# Patient Record
Sex: Female | Born: 1965 | Race: White | Marital: Married | Smoking: Current every day smoker
Health system: Southern US, Community
[De-identification: ages and names within clinical notes are randomized; demographics above are authoritative.]

## PROBLEM LIST (undated history)

## (undated) DIAGNOSIS — M5442 Lumbago with sciatica, left side: Secondary | ICD-10-CM

## (undated) DIAGNOSIS — J45909 Unspecified asthma, uncomplicated: Secondary | ICD-10-CM

## (undated) DIAGNOSIS — I10 Essential (primary) hypertension: Secondary | ICD-10-CM

## (undated) DIAGNOSIS — K219 Gastro-esophageal reflux disease without esophagitis: Secondary | ICD-10-CM

## (undated) DIAGNOSIS — M797 Fibromyalgia: Secondary | ICD-10-CM

## (undated) DIAGNOSIS — M810 Age-related osteoporosis without current pathological fracture: Secondary | ICD-10-CM

## (undated) DIAGNOSIS — J449 Chronic obstructive pulmonary disease, unspecified: Secondary | ICD-10-CM

## (undated) DIAGNOSIS — K2950 Unspecified chronic gastritis without bleeding: Secondary | ICD-10-CM

## (undated) DIAGNOSIS — M4850XA Collapsed vertebra, not elsewhere classified, site unspecified, initial encounter for fracture: Secondary | ICD-10-CM

## (undated) HISTORY — PX: APPENDECTOMY: SHX54

## (undated) HISTORY — PX: CHOLECYSTECTOMY: SHX55

---
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
## 2018-04-21 ENCOUNTER — Other Ambulatory Visit: Payer: Self-pay

## 2018-04-21 ENCOUNTER — Emergency Department
Admission: EM | Admit: 2018-04-21 | Discharge: 2018-04-21 | Disposition: A | Discharge status: Home / Self Care | Payer: PRIVATE HEALTH INSURANCE | Attending: Emergency Medicine | Admitting: Emergency Medicine

## 2018-04-21 ENCOUNTER — Emergency Department: Payer: PRIVATE HEALTH INSURANCE

## 2018-04-21 DIAGNOSIS — W010XXA Fall on same level from slipping, tripping and stumbling without subsequent striking against object, initial encounter: Secondary | ICD-10-CM | POA: Diagnosis not present

## 2018-04-21 DIAGNOSIS — J45909 Unspecified asthma, uncomplicated: Secondary | ICD-10-CM | POA: Insufficient documentation

## 2018-04-21 DIAGNOSIS — J449 Chronic obstructive pulmonary disease, unspecified: Secondary | ICD-10-CM | POA: Diagnosis not present

## 2018-04-21 DIAGNOSIS — M25562 Pain in left knee: Secondary | ICD-10-CM | POA: Insufficient documentation

## 2018-04-21 DIAGNOSIS — M25561 Pain in right knee: Secondary | ICD-10-CM | POA: Insufficient documentation

## 2018-04-21 DIAGNOSIS — Y9389 Activity, other specified: Secondary | ICD-10-CM | POA: Insufficient documentation

## 2018-04-21 DIAGNOSIS — Y999 Unspecified external cause status: Secondary | ICD-10-CM | POA: Diagnosis not present

## 2018-04-21 DIAGNOSIS — Y92009 Unspecified place in unspecified non-institutional (private) residence as the place of occurrence of the external cause: Secondary | ICD-10-CM | POA: Insufficient documentation

## 2018-04-21 HISTORY — DX: Fibromyalgia: M79.7

## 2018-04-21 HISTORY — DX: Chronic obstructive pulmonary disease, unspecified: J44.9

## 2018-04-21 HISTORY — DX: Gastro-esophageal reflux disease without esophagitis: K21.9

## 2018-04-21 HISTORY — DX: Unspecified asthma, uncomplicated: J45.909

## 2018-04-21 MED ORDER — TRAMADOL HCL 50 MG PO TABS: 50.0000 mg | ORAL_TABLET | Freq: Four times a day (QID) | ORAL | 0 refills | Status: DC | PRN

## 2018-04-21 MED ORDER — TRAMADOL HCL 50 MG PO TABS
50.0000 mg | ORAL_TABLET | Freq: Once | ORAL | Status: AC
  Administered 2018-04-21: 50 mg via ORAL
  Filled 2018-04-21: qty 1

## 2018-04-21 NOTE — ED Notes (Addendum)
Pt states Wednesday was playing with dog and fell on bilat knees inside house. States pain began Thursday AM. States R worse than L. No bruising noted. Movement makes pain worse per pt.   Alert, oriented, no distress noted. Family with pt.

## 2018-04-21 NOTE — ED Notes (Signed)
Pt returned from xray via stretcher.

## 2018-04-21 NOTE — ED Triage Notes (Signed)
Bilateral knee pain since came down on knees 3 days ago.

## 2018-04-21 NOTE — Discharge Instructions (Signed)
Follow up with either orthopedics or primary care if not improving over the week. Return to the ER for symptoms that change or worsen if unable to schedule an appointment.

## 2018-04-21 NOTE — ED Provider Notes (Signed)
[facility] Provider Note ____________________________________________  Time seen: Approximately 9:25 AM  I have reviewed the triage vital signs and the nursing notes.   HISTORY  Chief Complaint Knee Pain    HPI Haley Hebert is a 52 y.o. female who presents to the emergency department for evaluation and treatment of bilateral knee pain after mechanical, non-syncopal fall 3 days ago.  She states she came in side after playing with the dogs outside and slipped on a rug.  She states that she landed directly on her knees.  She did not experience pain immediately afterward, but the next day she awakened with severe pain that has not been relieved with ibuprofen.  She has been unable to seek medical attention for this complaint because she had to transport her a child from [place] back to this area.  Past Medical History:  Diagnosis Date  . Acid reflux   . Asthma   . COPD (chronic obstructive pulmonary disease) ([place])   . Fibromyalgia     There are no active problems to display for this patient.   Prior to Admission medications   Medication Sig Start Date End Date Taking? Authorizing Provider  traMADol (ULTRAM) 50 MG tablet Take 1 tablet (50 mg total) by mouth every 6 (six) hours as needed. 04/21/18   Victorino Dike, FNP    Allergies Patient has no known allergies.  No family history on file.  Social History Social History   Tobacco Use  . Smoking status: Not on file  Substance Use Topics  . Alcohol use: Not on file  . Drug use: Not on file    Review of Systems Constitutional: Negative for fever. Cardiovascular: Negative for chest pain. Respiratory: Negative for shortness of breath. Musculoskeletal: Positive for bilateral knee pain with radiation into the lower extremities Skin: Negative for open wound or lesion. Neurological: Negative for decrease in  sensation  ____________________________________________   PHYSICAL EXAM:  VITAL SIGNS: ED Triage Vitals  Enc Vitals Group     BP 04/21/18 0859 113/76     Pulse Rate 04/21/18 0859 66     Resp 04/21/18 0859 18     Temp 04/21/18 0859 97.7 F (36.5 C)     Temp Source 04/21/18 0859 Oral     SpO2 04/21/18 0859 94 %     Weight 04/21/18 0901 202 lb (91.6 kg)     Height 04/21/18 0901 5\' 2"  (1.575 m)     Head Circumference --      Peak Flow --      Pain Score 04/21/18 0901 9     Pain Loc --      Pain Edu? --      Excl. in [place]? --     Constitutional: Alert and oriented. Well appearing and in no acute distress. Eyes: Conjunctivae are clear without discharge or drainage Head: Atraumatic Neck: Supple.  No focal midline tenderness Respiratory: No cough. Respirations are even and unlabored. Musculoskeletal: Diffuse tenderness over the anterior knees bilaterally.  No focal tenderness.  No edema or obvious joint effusion.  No deformity of the lower extremities on exam.  No edema in the ankles.  Full, active range of motion observed while distracted. Neurologic: Motor and sensory function is intact. Skin: Intact.  No lesions, abrasions, or contusions noted. Psychiatric: Affect and behavior are appropriate.  ____________________________________________   LABS (all labs ordered are listed, but only abnormal results are displayed)  Labs Reviewed - No data to display ____________________________________________  RADIOLOGY  Right and left knee images are negative for bony abnormality per radiology. ____________________________________________   PROCEDURES  Procedures  ____________________________________________   INITIAL IMPRESSION / ASSESSMENT AND PLAN / ED COURSE  Ben Sanz is a 52 y.o. who presents to the emergency department for treatment and evaluation of bilateral knee pain.  She states that the right knee is more painful than the left.  Distracted, the patient flexes  and extends the knees but asked to do so on exam, she guards and is resistant to rotation.  Patient instructed to follow-up with orthopedics or primary care if not improving over the week.  She was also instructed to return to the emergency department for symptoms that change or worsen if unable schedule an appointment with orthopedics or primary care.  Medications  traMADol (ULTRAM) tablet 50 mg (has no administration in time range)    Pertinent labs & imaging results that were available during my care of the patient were reviewed by me and considered in my medical decision making (see chart for details).  _________________________________________   FINAL CLINICAL IMPRESSION(S) / ED DIAGNOSES  Final diagnoses:  Acute pain of both knees    ED Discharge Orders         Ordered    traMADol (ULTRAM) 50 MG tablet  Every 6 hours PRN     04/21/18 1028           If controlled substance prescribed during this visit, 12 month history viewed on the [place] prior to issuing an initial prescription for Schedule II or III opiod.    Victorino Dike, FNP 04/21/18 1034    Earleen Newport, MD 04/21/18 [phone]

## 2018-08-06 DIAGNOSIS — K2100 Gastro-esophageal reflux disease with esophagitis, without bleeding: Secondary | ICD-10-CM

## 2018-08-06 DIAGNOSIS — K2950 Unspecified chronic gastritis without bleeding: Secondary | ICD-10-CM

## 2018-08-06 HISTORY — DX: Unspecified chronic gastritis without bleeding: K29.50

## 2018-08-06 HISTORY — DX: Gastro-esophageal reflux disease with esophagitis, without bleeding: K21.00

## 2018-08-27 ENCOUNTER — Ambulatory Visit: Payer: PRIVATE HEALTH INSURANCE | Admitting: Nurse Practitioner

## 2018-09-25 ENCOUNTER — Ambulatory Visit: Payer: PRIVATE HEALTH INSURANCE | Admitting: Nurse Practitioner

## 2018-10-11 DIAGNOSIS — K219 Gastro-esophageal reflux disease without esophagitis: Secondary | ICD-10-CM

## 2018-10-11 DIAGNOSIS — J45909 Unspecified asthma, uncomplicated: Secondary | ICD-10-CM | POA: Insufficient documentation

## 2018-10-11 DIAGNOSIS — M810 Age-related osteoporosis without current pathological fracture: Secondary | ICD-10-CM

## 2018-10-11 DIAGNOSIS — I10 Essential (primary) hypertension: Secondary | ICD-10-CM

## 2018-10-11 DIAGNOSIS — M4850XA Collapsed vertebra, not elsewhere classified, site unspecified, initial encounter for fracture: Secondary | ICD-10-CM

## 2018-10-11 DIAGNOSIS — J449 Chronic obstructive pulmonary disease, unspecified: Secondary | ICD-10-CM | POA: Insufficient documentation

## 2018-10-11 DIAGNOSIS — J441 Chronic obstructive pulmonary disease with (acute) exacerbation: Secondary | ICD-10-CM | POA: Insufficient documentation

## 2018-10-11 DIAGNOSIS — M5442 Lumbago with sciatica, left side: Secondary | ICD-10-CM

## 2018-10-11 HISTORY — DX: Gastro-esophageal reflux disease without esophagitis: K21.9

## 2018-10-11 HISTORY — DX: Age-related osteoporosis without current pathological fracture: M81.0

## 2018-10-11 HISTORY — DX: Lumbago with sciatica, left side: M54.42

## 2018-10-11 HISTORY — DX: Collapsed vertebra, not elsewhere classified, site unspecified, initial encounter for fracture: M48.50XA

## 2018-10-11 HISTORY — DX: Essential (primary) hypertension: I10

## 2018-10-17 ENCOUNTER — Ambulatory Visit: Payer: PRIVATE HEALTH INSURANCE | Admitting: Nurse Practitioner

## 2018-12-21 ENCOUNTER — Ambulatory Visit: Payer: Self-pay | Admitting: Urology

## 2019-01-22 ENCOUNTER — Other Ambulatory Visit: Payer: Self-pay

## 2019-01-22 DIAGNOSIS — N393 Stress incontinence (female) (male): Secondary | ICD-10-CM

## 2019-01-25 ENCOUNTER — Ambulatory Visit: Payer: PRIVATE HEALTH INSURANCE | Admitting: Urology

## 2019-03-01 ENCOUNTER — Ambulatory Visit: Payer: PRIVATE HEALTH INSURANCE | Admitting: Urology

## 2019-03-12 ENCOUNTER — Other Ambulatory Visit: Payer: Self-pay

## 2019-03-15 ENCOUNTER — Other Ambulatory Visit: Payer: Self-pay

## 2019-03-15 ENCOUNTER — Encounter: Payer: Self-pay | Admitting: Urology

## 2019-03-15 ENCOUNTER — Ambulatory Visit (INDEPENDENT_AMBULATORY_CARE_PROVIDER_SITE_OTHER): Payer: Medicaid Other | Admitting: Urology

## 2019-03-15 ENCOUNTER — Other Ambulatory Visit
Admission: RE | Admit: 2019-03-15 | Discharge: 2019-03-15 | Disposition: A | Discharge status: Home / Self Care | Payer: Medicaid Other | Attending: Urology | Admitting: Urology

## 2019-03-15 VITALS — BP 101/67 | HR 76 | Ht 62.0 in | Wt 207.0 lb

## 2019-03-15 DIAGNOSIS — R159 Full incontinence of feces: Secondary | ICD-10-CM | POA: Diagnosis not present

## 2019-03-15 DIAGNOSIS — N393 Stress incontinence (female) (male): Secondary | ICD-10-CM | POA: Diagnosis not present

## 2019-03-15 DIAGNOSIS — R350 Frequency of micturition: Secondary | ICD-10-CM | POA: Diagnosis not present

## 2019-03-15 LAB — URINALYSIS, COMPLETE (UACMP) WITH MICROSCOPIC
Glucose, UA: NEGATIVE mg/dL
Hgb urine dipstick: NEGATIVE
Leukocytes,Ua: NEGATIVE
Nitrite: NEGATIVE
Protein, ur: NEGATIVE mg/dL
Specific Gravity, Urine: 1.025 (ref 1.005–1.030)
WBC, UA: NONE SEEN WBC/hpf (ref 0–5)
pH: 5 (ref 5.0–8.0)

## 2019-03-15 LAB — BLADDER SCAN AMB NON-IMAGING

## 2019-03-15 MED ORDER — OXYBUTYNIN CHLORIDE ER 15 MG PO TB24: 15.0000 mg | ORAL_TABLET | Freq: Every day | ORAL | 3 refills | Status: DC

## 2019-03-15 NOTE — Progress Notes (Signed)
03/15/2019 3:18 PM   [facility] [birth year] [id number]  Referring provider: Ulyess Blossom, [place],  [place] [address]  Chief Complaint  Patient presents with  . Urinary Incontinence    HPI: 53 year old female who presents today for further evaluation of urinary incontinence.  She initially thought that she was seeing pain management today and did not realize that she had been referred to urology.  She does report that she has had both urinary and fecal incontinence for years now.  This is bothersome to her.  She reports that she has daytime frequency and urgency with least a weekly episode of urge incontinence.  She gets up 2 times at night to void.  She occasionally has enuresis as well.  She also leaks when she laughs, coughs, sneezes.  This is been going on for 10+ years as well.  She has COPD/asthma and thus a chronic cough with ongoing smoking which exacerbates her symptoms.  She has had 4 vaginal deliveries.  No history of hysterectomy.  No UTIs.  No dysuria or hematuria.  She drinks at least 2 cups of coffee very day.  When her daughter was here, she reports that she drink "coffee all day every day"   she has since cut back.  She also drinks iced tea frequently.  In terms of fecal incontinence, she has loose stool often and occasionally cannot control defecation.  She is seeing gastroenterology in the near future.  She denies any symptoms of vaginal bulging or pain with intercourse.  She is never tried any medications for OAB.  No exacerbating or alleviating symptoms.  PMH: Past Medical History:  Diagnosis Date  . Acid reflux   . Asthma   . Chronic gastritis 08/06/2018  . Collapsed vertebra ([place]) 10/11/2018   lumbosacral  . COPD (chronic obstructive pulmonary disease) ([place])   . Esophagitis, reflux 08/06/2018  . Fibromyalgia   . GERD (gastroesophageal reflux disease) 10/11/2018  . Hypertension 10/11/2018  . Lumbago with sciatica, left side  10/11/2018  . Osteoporosis 10/11/2018    Surgical History: History reviewed. No pertinent surgical history.  Home Medications:  Allergies as of 03/15/2019   No Known Allergies     Medication List       Accurate as of March 15, 2019 11:59 PM. If you have any questions, ask your nurse or doctor.        STOP taking these medications   escitalopram 20 MG tablet Commonly known as: LEXAPRO Stopped by: Hollice Espy, MD     TAKE these medications   busPIRone 7.5 MG tablet Commonly known as: BUSPAR TAKE 1 TABLET (7.5 MG TOTAL) BY MOUTH 2 (TWO) TIMES DAILY FOR 90 DAYS   cetirizine 10 MG tablet Commonly known as: ZYRTEC Take 10 mg by mouth daily.   meloxicam 15 MG tablet Commonly known as: MOBIC TAKE 1 TABLET (15 MG TOTAL) BY MOUTH ONCE DAILY AS NEEDED FOR PAIN   montelukast 10 MG tablet Commonly known as: SINGULAIR TAKE 1 TABLET BY MOUTH EVERY DAY AT NIGHT   omeprazole 40 MG capsule Commonly known as: PRILOSEC Take 40 mg by mouth 2 (two) times daily.   oxybutynin 15 MG 24 hr tablet Commonly known as: DITROPAN XL Take 1 tablet (15 mg total) by mouth daily. Started by: Hollice Espy, MD   pregabalin 200 MG capsule Commonly known as: LYRICA Take 200 mg by mouth 2 (two) times daily.   tiZANidine 4 MG tablet Commonly known as: ZANAFLEX TAKE 1  TABLET (4 MG TOTAL) BY MOUTH 3 (THREE) TIMES DAILY AS NEEDED FOR MUSCLE SPASMS   traMADol 50 MG tablet Commonly known as: Ultram Take 1 tablet (50 mg total) by mouth every 6 (six) hours as needed.   traZODone 150 MG tablet Commonly known as: DESYREL TAKE 2 TABLETS (300 MG TOTAL) BY MOUTH NIGHTLY   Trelegy Ellipta 100-62.5-25 MCG/INH Aepb Generic drug: Fluticasone-Umeclidin-Vilant INHALE 1 INHALATION INTO THE LUNGS EVERY MORNING       Allergies: No Known Allergies  Family History: History reviewed. No pertinent family history.  Social History:  has no history on file for tobacco, alcohol, and drug.  ROS:  UROLOGY Frequent Urination?: Yes Hard to postpone urination?: Yes Burning/pain with urination?: No Get up at night to urinate?: Yes Leakage of urine?: Yes Urine stream starts and stops?: No Trouble starting stream?: No Do you have to strain to urinate?: No Blood in urine?: No Urinary tract infection?: No Sexually transmitted disease?: No Injury to kidneys or bladder?: No Painful intercourse?: No Weak stream?: No Currently pregnant?: No Vaginal bleeding?: No Last menstrual period?: n  Gastrointestinal Nausea?: No Vomiting?: No Indigestion/heartburn?: No Diarrhea?: No Constipation?: No  Constitutional Fever: No Night sweats?: Yes Weight loss?: No Fatigue?: No  Skin Skin rash/lesions?: No Itching?: No  Eyes Blurred vision?: Yes Double vision?: No  Ears/Nose/Throat Sore throat?: No Sinus problems?: Yes  Hematologic/Lymphatic Swollen glands?: No Easy bruising?: Yes  Cardiovascular Leg swelling?: Yes Chest pain?: No  Respiratory Cough?: Yes Shortness of breath?: Yes  Endocrine Excessive thirst?: Yes  Musculoskeletal Back pain?: Yes Joint pain?: Yes  Neurological Headaches?: No Dizziness?: No  Psychologic Depression?: Yes Anxiety?: Yes  Physical Exam: BP 101/67   Pulse 76   Ht 5\' 2"  (1.575 m)   Wt 207 lb (93.9 kg)   BMI 37.86 kg/m   Constitutional:  Alert and oriented, No acute distress. HEENT: [place] AT, moist mucus membranes.  Trachea midline, no masses. Cardiovascular: No clubbing, cyanosis, or edema. Respiratory: Normal respiratory effort, no increased work of breathing. GI: Abdomen is soft, nontender, nondistended, no abdominal masses, obese Skin: No rashes, bruises or suspicious lesions. Neurologic: Grossly intact, no focal deficits, moving all 4 extremities. Psychiatric: Normal mood and affect.  Laboratory Data: Cr 1.0  1/20  Urinalysis Results for orders placed or performed during the hospital encounter of 03/15/19  Urinalysis,  Complete w Microscopic  Result Value Ref Range   Color, Urine YELLOW YELLOW   APPearance HAZY (A) CLEAR   Specific Gravity, Urine 1.025 1.005 - 1.030   pH 5.0 5.0 - 8.0   Glucose, UA NEGATIVE NEGATIVE mg/dL   Hgb urine dipstick NEGATIVE NEGATIVE   Bilirubin Urine SMALL (A) NEGATIVE   Ketones, ur TRACE (A) NEGATIVE mg/dL   Protein, ur NEGATIVE NEGATIVE mg/dL   Nitrite NEGATIVE NEGATIVE   Leukocytes,Ua NEGATIVE NEGATIVE   Squamous Epithelial / LPF 11-20 0 - 5   WBC, UA NONE SEEN 0 - 5 WBC/hpf   RBC / HPF 0-5 0 - 5 RBC/hpf   Bacteria, UA MANY (A) NONE SEEN   Ca Oxalate Crys, UA PRESENT      Pertinent Imaging: PVR minimal (35 cc)  Assessment & Plan:    1. Stress incontinence of urine Moderate stress incontinence, symptomatic  We discussed the pathophysiology of stress urinary incontinence.  Ideally, recommend control of respiratory disease which exacerbates her symptoms.  In addition to this, she would benefit from weight loss and pelvic floor exercises.  She was provided handout with how  to perform these and is interested in pursuing this.  Offered but declined physical therapy referral.  Relatively poor surgical candidate but may consider this down the road if her symptoms fail to improve with the above interventions. - BLADDER SCAN AMB NON-IMAGING  2. Urinary frequency Likely multifactorial  Lengthy discussion today about behavioral modification which would be the primary intervention.  She is interested in pharmacotherapy.  We will try oxybutynin 15 mg daily, adjust as needed.  Aware of possible side effects including dry eyes dry mouth and constipation.  3. Incontinence of feces, unspecified fecal incontinence type Fecal incontinence, etiology unclear  She is scheduled follow-up with GI.   Return in about 3 months (around 06/14/2019) for shannon.  Hollice Espy, MD  [facility] [address] [place], [place] [address] [phone]

## 2019-03-15 NOTE — Patient Instructions (Signed)

## 2019-03-18 ENCOUNTER — Encounter: Payer: Self-pay | Admitting: Urology

## 2019-06-17 ENCOUNTER — Ambulatory Visit: Payer: Medicaid Other | Admitting: Urology

## 2019-06-17 ENCOUNTER — Telehealth: Payer: Self-pay | Admitting: Urology

## 2019-06-17 NOTE — Telephone Encounter (Signed)
We can discuss any medication changes at her rescheduled appointment.

## 2019-06-17 NOTE — Telephone Encounter (Signed)
[facility] informing patient that medication changes can be discussed at next appointment.

## 2019-06-17 NOTE — Telephone Encounter (Signed)
Pt called to reschedule appt and asked to have her medication changed as well. The medication she's taking now isn't working. Please advise pt at [phone]

## 2019-07-07 NOTE — Progress Notes (Deleted)
07/08/2019 8:32 PM   [facility] 1966/06/20 [id number]  Referring provider: Ulyess Blossom, [address] [place],  [place] [address]  No chief complaint on file.   HPI: [facility]aley [facility] is a 54 year old female with urinary and fecal incontinence who presents today after a trial of oxybutynin.  She was seen by Dr. Erlene Quan on 03/15/2019 and was advised to decrease her weight, work with her PCP to getting better control of her coughing and to decrease her coffee intake.  She was started on oxybutynin.  The patient is  experiencing urgency x *** (***), frequency x *** (***), not/is restricting fluids to avoid visits to the restroom ***, not/is engaging in toilet mapping, incontinence x *** (***) and nocturia x *** (***).   Her BP is ***.   Her PVR is ***.    PMH: Past Medical History:  Diagnosis Date  . Acid reflux   . Asthma   . Chronic gastritis 08/06/2018  . Collapsed vertebra ([place]) 10/11/2018   lumbosacral  . COPD (chronic obstructive pulmonary disease) ([place])   . Esophagitis, reflux 08/06/2018  . Fibromyalgia   . GERD (gastroesophageal reflux disease) 10/11/2018  . Hypertension 10/11/2018  . Lumbago with sciatica, left side 10/11/2018  . Osteoporosis 10/11/2018    Surgical History: No past surgical history on file.  Home Medications:  Allergies as of 07/08/2019   No Known Allergies     Medication List       Accurate as of July 07, 2019  8:32 PM. If you have any questions, ask your nurse or doctor.        busPIRone 7.5 MG tablet Commonly known as: BUSPAR TAKE 1 TABLET (7.5 MG TOTAL) BY MOUTH 2 (TWO) TIMES DAILY FOR 90 DAYS   cetirizine 10 MG tablet Commonly known as: ZYRTEC Take 10 mg by mouth daily.   meloxicam 15 MG tablet Commonly known as: MOBIC TAKE 1 TABLET (15 MG TOTAL) BY MOUTH ONCE DAILY AS NEEDED FOR PAIN   montelukast 10 MG tablet Commonly known as: SINGULAIR TAKE 1 TABLET BY MOUTH EVERY DAY AT NIGHT   omeprazole 40 MG capsule Commonly  known as: PRILOSEC Take 40 mg by mouth 2 (two) times daily.   oxybutynin 15 MG 24 hr tablet Commonly known as: DITROPAN XL Take 1 tablet (15 mg total) by mouth daily.   pregabalin 200 MG capsule Commonly known as: LYRICA Take 200 mg by mouth 2 (two) times daily.   tiZANidine 4 MG tablet Commonly known as: ZANAFLEX TAKE 1 TABLET (4 MG TOTAL) BY MOUTH 3 (THREE) TIMES DAILY AS NEEDED FOR MUSCLE SPASMS   traMADol 50 MG tablet Commonly known as: Ultram Take 1 tablet (50 mg total) by mouth every 6 (six) hours as needed.   traZODone 150 MG tablet Commonly known as: DESYREL TAKE 2 TABLETS (300 MG TOTAL) BY MOUTH NIGHTLY   Trelegy Ellipta 100-62.5-25 MCG/INH Aepb Generic drug: Fluticasone-Umeclidin-Vilant INHALE 1 INHALATION INTO THE LUNGS EVERY MORNING       Allergies: No Known Allergies  Family History: No family history on file.  Social History:  has no history on file for tobacco, alcohol, and drug.  ROS:                                        Physical Exam: There were no vitals taken for this visit.  Constitutional:  Well nourished.  Alert and oriented, No acute distress. HEENT: [place] AT, moist mucus membranes.  Trachea midline, no masses. Cardiovascular: No clubbing, cyanosis, or edema. Respiratory: Normal respiratory effort, no increased work of breathing. GI: Abdomen is soft, non tender, non distended, no abdominal masses. Liver and spleen not palpable.  No hernias appreciated.  Stool sample for occult testing is not indicated.   GU: No CVA tenderness.  No bladder fullness or masses.  *** external genitalia, *** pubic hair distribution, no lesions.  Normal urethral meatus, no lesions, no prolapse, no discharge.   No urethral masses, tenderness and/or tenderness. No bladder fullness, tenderness or masses. *** vagina mucosa, *** estrogen effect, no discharge, no lesions, *** pelvic support, *** cystocele and *** rectocele noted.  No cervical motion  tenderness.  Uterus is freely mobile and non-fixed.  No adnexal/parametria masses or tenderness noted.  Anus and perineum are without rashes or lesions.   ***  Skin: No rashes, bruises or suspicious lesions. Lymph: No cervical or inguinal adenopathy. Neurologic: Grossly intact, no focal deficits, moving all 4 extremities. Psychiatric: Normal mood and affect.   Laboratory Data: No results found for: WBC, HGB, HCT, MCV, PLT  No results found for: CREATININE  No results found for: PSA  No results found for: TESTOSTERONE  No results found for: HGBA1C  No results found for: TSH  No results found for: CHOL, HDL, CHOLHDL, VLDL, LDLCALC  No results found for: AST No results found for: ALT No components found for: ALKALINEPHOPHATASE No components found for: BILIRUBINTOTAL  No results found for: ESTRADIOL  Urinalysis    Component Value Date/Time   COLORURINE YELLOW 03/15/2019 1321   APPEARANCEUR HAZY (A) 03/15/2019 1321   LABSPEC 1.025 03/15/2019 1321   PHURINE 5.0 03/15/2019 1321   GLUCOSEU NEGATIVE 03/15/2019 [place] 03/15/2019 1321   BILIRUBINUR SMALL (A) 03/15/2019 [place] (A) 03/15/2019 1321   PROTEINUR NEGATIVE 03/15/2019 1321   NITRITE NEGATIVE 03/15/2019 1321   [place] 03/15/2019 1321    I have reviewed the labs.   Pertinent Imaging: *** I have independently reviewed the films.    Assessment & Plan:  ***  1. Mixed Incontinence  - offered behavioral therapies  - bladder training  - bladder control strategies  - pelvic floor muscle training  - fluid management   - offered medical therapy with anticholinergic therapy or beta-3 adrenergic receptor agonist and the potential side effects of each therapy ***  - offered refer to gynecology for a pessary fitting ***  - offered an appointment with one of our surgeon for a possible pelvic sling procedure ***  - would like to try the beta-3 adrenergic receptor agonist  (Myrbetriq).  Given Myrbetriq 25 mg samples, #28.  I have reviewed with the patient of the side effects of Myrbetriq, such as: elevation in BP, urinary retention and/or HA.  She will return in one month for PVR and symptom recheck.  ***  - would like to try anticholinergic therapy.  Given Vesicare 5mg /10mg , Toviaz 4mg /8mg  samples, # 28.   Advised of the side effects, such as: Dry eyes, dry mouth, constipation, mental confusion and/or urinary retention. ***  - RTC in 3 weeks for PVR and symptom recheck ***   2. Fecal incontinence ***  No follow-ups on file.  These notes generated with voice recognition software. I apologize for typographical errors.  Zara Council, PA-C  [facility] [address]  [place], [place] [address] [phone]

## 2019-07-08 ENCOUNTER — Ambulatory Visit: Payer: Medicaid Other | Admitting: Urology

## 2019-07-16 ENCOUNTER — Ambulatory Visit
Admission: EM | Admit: 2019-07-16 | Discharge: 2019-07-16 | Disposition: A | Discharge status: Home / Self Care | Payer: Medicaid Other | Attending: Family Medicine | Admitting: Family Medicine

## 2019-07-16 ENCOUNTER — Other Ambulatory Visit: Payer: Self-pay

## 2019-07-16 ENCOUNTER — Encounter: Payer: Self-pay | Admitting: Emergency Medicine

## 2019-07-16 ENCOUNTER — Ambulatory Visit: Payer: Medicaid Other

## 2019-07-16 DIAGNOSIS — M5442 Lumbago with sciatica, left side: Secondary | ICD-10-CM | POA: Insufficient documentation

## 2019-07-16 DIAGNOSIS — M797 Fibromyalgia: Secondary | ICD-10-CM | POA: Diagnosis not present

## 2019-07-16 DIAGNOSIS — M533 Sacrococcygeal disorders, not elsewhere classified: Secondary | ICD-10-CM | POA: Diagnosis not present

## 2019-07-16 DIAGNOSIS — W11XXXA Fall on and from ladder, initial encounter: Secondary | ICD-10-CM

## 2019-07-16 DIAGNOSIS — G8929 Other chronic pain: Secondary | ICD-10-CM | POA: Insufficient documentation

## 2019-07-16 DIAGNOSIS — W19XXXA Unspecified fall, initial encounter: Secondary | ICD-10-CM | POA: Insufficient documentation

## 2019-07-16 DIAGNOSIS — M810 Age-related osteoporosis without current pathological fracture: Secondary | ICD-10-CM | POA: Diagnosis not present

## 2019-07-16 DIAGNOSIS — K219 Gastro-esophageal reflux disease without esophagitis: Secondary | ICD-10-CM | POA: Insufficient documentation

## 2019-07-16 DIAGNOSIS — Z79899 Other long term (current) drug therapy: Secondary | ICD-10-CM | POA: Insufficient documentation

## 2019-07-16 DIAGNOSIS — J449 Chronic obstructive pulmonary disease, unspecified: Secondary | ICD-10-CM | POA: Diagnosis not present

## 2019-07-16 DIAGNOSIS — I10 Essential (primary) hypertension: Secondary | ICD-10-CM | POA: Diagnosis not present

## 2019-07-16 DIAGNOSIS — F17210 Nicotine dependence, cigarettes, uncomplicated: Secondary | ICD-10-CM | POA: Insufficient documentation

## 2019-07-16 MED ORDER — PREDNISONE 10 MG PO TABS: ORAL_TABLET | ORAL | 0 refills | Status: DC

## 2019-07-16 NOTE — ED Provider Notes (Signed)
[facility] ____________________________________________  Time seen: Approximately 3:02 PM  I have reviewed the triage vital signs and the nursing notes.   HISTORY  Chief Complaint Tailbone Pain   HPI Haley Hebert is a 54 y.o. female presenting for evaluation of coccyx pain post fall.  Patient reports that 1 month ago she fell off of her ladder in her house.  States she fell approximately 4 feet to her pelvis.  States since then she has been having lower coccyx and sacral pain.  States it took a few days for the pain to really kick in and states the pain has been there since.  Does report she has chronic low back pain at baseline describing as her chronic sciatica, and reports no change in this.  States she does at baseline have occasional pain radiation down her leg but no changes.  Denies abdominal pain, chest pain, shortness of breath, paresthesias, unilateral weakness, urinary or bowel retention or incontinence or hip pain.  States that she may have hit her head when she fell and has had occasional headaches, but reports feeling better, no loss of consciousness, no confusion, no vision changes, no vomiting.  Reports otherwise doing well.  Has not had any images for this.  Ulyess Blossom, PA : PCP    Past Medical History:  Diagnosis Date  . Acid reflux   . Asthma   . Chronic gastritis 08/06/2018  . Collapsed vertebra ([place]) 10/11/2018   lumbosacral  . COPD (chronic obstructive pulmonary disease) ([place])   . Esophagitis, reflux 08/06/2018  . Fibromyalgia   . GERD (gastroesophageal reflux disease) 10/11/2018  . Hypertension 10/11/2018  . Lumbago with sciatica, left side 10/11/2018  . Osteoporosis 10/11/2018    Patient Active Problem List   Diagnosis Date Noted  . Asthma 10/11/2018  . Collapsed vertebra ([place]) 10/11/2018  . COPD (chronic obstructive pulmonary disease) ([place]) 10/11/2018  . GERD (gastroesophageal reflux disease) 10/11/2018  . Hypertension 10/11/2018  .  Lumbago with sciatica, left side 10/11/2018  . Osteoporosis 10/11/2018  . Chronic gastritis 08/06/2018  . Esophagitis, reflux 08/06/2018    Past Surgical History:  Procedure Laterality Date  . APPENDECTOMY    . CHOLECYSTECTOMY       No current facility-administered medications for this encounter.  Current Outpatient Medications:  .  busPIRone (BUSPAR) 7.5 MG tablet, TAKE 1 TABLET (7.5 MG TOTAL) BY MOUTH 2 (TWO) TIMES DAILY FOR 90 DAYS, Disp: , Rfl:  .  cetirizine (ZYRTEC) 10 MG tablet, Take 10 mg by mouth daily., Disp: , Rfl:  .  montelukast (SINGULAIR) 10 MG tablet, TAKE 1 TABLET BY MOUTH EVERY DAY AT NIGHT, Disp: , Rfl:  .  omeprazole (PRILOSEC) 40 MG capsule, Take 40 mg by mouth 2 (two) times daily., Disp: , Rfl:  .  oxybutynin (DITROPAN XL) 15 MG 24 hr tablet, Take 1 tablet (15 mg total) by mouth daily., Disp: 30 tablet, Rfl: 3 .  pregabalin (LYRICA) 200 MG capsule, Take 200 mg by mouth 2 (two) times daily., Disp: , Rfl:  .  tiZANidine (ZANAFLEX) 4 MG tablet, TAKE 1 TABLET (4 MG TOTAL) BY MOUTH 3 (THREE) TIMES DAILY AS NEEDED FOR MUSCLE SPASMS, Disp: , Rfl:  .  traZODone (DESYREL) 150 MG tablet, TAKE 2 TABLETS (300 MG TOTAL) BY MOUTH NIGHTLY, Disp: , Rfl:  .  TRELEGY ELLIPTA 100-62.5-25 MCG/INH AEPB, INHALE 1 INHALATION INTO THE LUNGS EVERY MORNING, Disp: , Rfl:  .  predniSONE (DELTASONE) 10 MG tablet, Start 60 mg po day  one, then 50 mg po day two, taper by 10 mg daily until complete., Disp: 21 tablet, Rfl: 0  Allergies Patient has no known allergies.  Family History  Problem Relation Age of Onset  . Kidney disease Mother   . Heart disease Mother   . Heart attack Father     Social History Social History   Tobacco Use  . Smoking status: Current Every Day Smoker    Packs/day: 1.00  . Smokeless tobacco: Never Used  Substance Use Topics  . Alcohol use: Not Currently  . Drug use: Not Currently    Review of Systems Constitutional: No fever Eyes: No visual  changes. ENT: No sore throat. Cardiovascular: Denies chest pain. Respiratory: Denies shortness of breath. Gastrointestinal: No abdominal pain.  No nausea, no vomiting.  No diarrhea.   Musculoskeletal: As above.  Skin: Negative for rash. Neurological: Negative for focal weakness or numbness.    ____________________________________________   PHYSICAL EXAM:  VITAL SIGNS: ED Triage Vitals  Enc Vitals Group     BP 07/16/19 1438 125/74     Pulse Rate 07/16/19 1438 62     Resp 07/16/19 1438 18     Temp 07/16/19 1438 98.4 F (36.9 C)     Temp Source 07/16/19 1438 Oral     SpO2 07/16/19 1438 96 %     Weight 07/16/19 1433 206 lb (93.4 kg)     Height 07/16/19 1433 5\' 2"  (1.575 m)     Head Circumference --      Peak Flow --      Pain Score 07/16/19 1432 7     Pain Loc --      Pain Edu? --      Excl. in [place]? --     Constitutional: Alert and oriented. Well appearing and in no acute distress. Eyes: Conjunctivae are normal.  ENT      Head: Normocephalic and atraumatic. Cardiovascular: Normal rate, regular rhythm. Grossly normal heart sounds.  Good peripheral circulation. Respiratory: Normal respiratory effort without tachypnea nor retractions. Breath sounds are clear and equal bilaterally. No wheezes, rales, rhonchi. Gastrointestinal: Soft and nontender. No CVA tenderness. Musculoskeletal: No midline cervical, thoracic or lumbar tenderness to palpation. Bilateral pedal pulses equal and easily palpated. Except: Moderate tenderness to lower sacrum and coccyx, no ecchymosis, no erythema, no edema, no lumbar tenderness.  No bilateral hip tenderness.  Minimal tenderness anterior pelvis, no abdominal or suprapubic tenderness.  Change positions quickly.  No saddle anesthesia.  Steady gait. Neurologic:  Normal speech and language. Speech is normal. No gait instability.  Skin:  Skin is warm, dry and intact. No rash noted. Psychiatric: Mood and affect are normal. Speech and behavior are normal.  Patient exhibits appropriate insight and judgment   ___________________________________________   LABS (all labs ordered are listed, but only abnormal results are displayed)  Labs Reviewed - No data to display ____________________________________________  RADIOLOGY  DG Pelvis 1-2 Views  Result Date: 07/16/2019 CLINICAL DATA:  54 year old female with fall and pain in the coccyx. EXAM: SACRUM AND COCCYX - 2+ VIEW; PELVIS - 1-2 VIEW COMPARISON:  None. FINDINGS: There is no acute fracture or dislocation. The bones are osteopenic. Mild bilateral hip arthritic changes. The soft tissues are unremarkable. IMPRESSION: Negative. Electronically Signed   By: Anner Crete M.D.   On: 07/16/2019 15:38   DG Sacrum/Coccyx  Result Date: 07/16/2019 CLINICAL DATA:  54 year old female with fall and pain in the coccyx. EXAM: SACRUM AND COCCYX - 2+ VIEW; PELVIS - 1-2  VIEW COMPARISON:  None. FINDINGS: There is no acute fracture or dislocation. The bones are osteopenic. Mild bilateral hip arthritic changes. The soft tissues are unremarkable. IMPRESSION: Negative. Electronically Signed   By: Anner Crete M.D.   On: 07/16/2019 15:38   ____________________________________________   PROCEDURES Procedures    INITIAL IMPRESSION / ASSESSMENT AND PLAN / ED COURSE  Pertinent labs & imaging results that were available during my care of the patient were reviewed by me and considered in my medical decision making (see chart for details).  Well-appearing patient.  No acute distress.  Had a mechanical fall approximately 1 month ago with coccyx pain.  Reports otherwise doing well.  Has not had any imaging.  Pelvis and sacrum coccyx x-rays evaluated today which were negative as above per radiologist.  Suspect contusion inflammatory pain.  Patient has home Lyrica as well as muscle relaxant, will treat with prednisone taper.  Encourage supportive care, ice, range of motion and donut pillow for support.Discussed  indication, risks and benefits of medications with patient.   Discussed follow up with Primary care physician this week. Discussed follow up and return parameters including no resolution or any worsening concerns. Patient verbalized understanding and agreed to plan.   ____________________________________________   FINAL CLINICAL IMPRESSION(S) / ED DIAGNOSES  Final diagnoses:  Coccyx pain  Fall, initial encounter     ED Discharge Orders         Ordered    predniSONE (DELTASONE) 10 MG tablet     07/16/19 1547           Note: This dictation was prepared with Dragon dictation along with smaller phrase technology. Any transcriptional errors that result from this process are unintentional.         Marylene Land, NP 07/16/19 1753

## 2019-07-16 NOTE — ED Triage Notes (Signed)
Pt c/o pain in her coccyx area. She states that 4 days before christmas she fell off a ladder. She did not start having pain until about 4 days after the fall. She states that she can ot sit down because of the pain.

## 2019-07-16 NOTE — Discharge Instructions (Addendum)
Take medication as prescribed. Rest. Drink plenty of fluids. Stretch.   Follow up with your primary care physician this week.  Return to Urgent care for new or worsening concerns.

## 2019-07-18 ENCOUNTER — Other Ambulatory Visit: Payer: Self-pay | Admitting: Urology

## 2019-07-29 ENCOUNTER — Other Ambulatory Visit: Payer: Self-pay | Admitting: Physician Assistant

## 2019-07-29 DIAGNOSIS — Z1231 Encounter for screening mammogram for malignant neoplasm of breast: Secondary | ICD-10-CM

## 2019-07-29 DIAGNOSIS — M810 Age-related osteoporosis without current pathological fracture: Secondary | ICD-10-CM

## 2019-08-18 ENCOUNTER — Other Ambulatory Visit: Payer: Self-pay | Admitting: Urology

## 2019-12-28 ENCOUNTER — Encounter: Payer: Self-pay | Admitting: Emergency Medicine

## 2019-12-28 ENCOUNTER — Other Ambulatory Visit: Payer: Self-pay

## 2019-12-28 ENCOUNTER — Emergency Department: Payer: Medicaid Other

## 2019-12-28 DIAGNOSIS — Y9389 Activity, other specified: Secondary | ICD-10-CM | POA: Insufficient documentation

## 2019-12-28 DIAGNOSIS — J45909 Unspecified asthma, uncomplicated: Secondary | ICD-10-CM | POA: Diagnosis not present

## 2019-12-28 DIAGNOSIS — R0789 Other chest pain: Secondary | ICD-10-CM | POA: Insufficient documentation

## 2019-12-28 DIAGNOSIS — I10 Essential (primary) hypertension: Secondary | ICD-10-CM | POA: Diagnosis not present

## 2019-12-28 DIAGNOSIS — Y999 Unspecified external cause status: Secondary | ICD-10-CM | POA: Diagnosis not present

## 2019-12-28 DIAGNOSIS — F17210 Nicotine dependence, cigarettes, uncomplicated: Secondary | ICD-10-CM | POA: Diagnosis not present

## 2019-12-28 DIAGNOSIS — E876 Hypokalemia: Secondary | ICD-10-CM | POA: Insufficient documentation

## 2019-12-28 DIAGNOSIS — S20211A Contusion of right front wall of thorax, initial encounter: Secondary | ICD-10-CM | POA: Diagnosis not present

## 2019-12-28 DIAGNOSIS — Y9289 Other specified places as the place of occurrence of the external cause: Secondary | ICD-10-CM | POA: Diagnosis not present

## 2019-12-28 DIAGNOSIS — J449 Chronic obstructive pulmonary disease, unspecified: Secondary | ICD-10-CM | POA: Insufficient documentation

## 2019-12-28 DIAGNOSIS — R0781 Pleurodynia: Secondary | ICD-10-CM | POA: Diagnosis present

## 2019-12-28 DIAGNOSIS — R0602 Shortness of breath: Secondary | ICD-10-CM | POA: Insufficient documentation

## 2019-12-28 DIAGNOSIS — W08XXXA Fall from other furniture, initial encounter: Secondary | ICD-10-CM | POA: Insufficient documentation

## 2019-12-28 LAB — CBC
HCT: 37.8 % (ref 36.0–46.0)
Hemoglobin: 13.2 g/dL (ref 12.0–15.0)
MCH: 30.3 pg (ref 26.0–34.0)
MCHC: 34.9 g/dL (ref 30.0–36.0)
MCV: 86.7 fL (ref 80.0–100.0)
Platelets: 210 10*3/uL (ref 150–400)
RBC: 4.36 MIL/uL (ref 3.87–5.11)
RDW: 13.3 % (ref 11.5–15.5)
WBC: 8.5 10*3/uL (ref 4.0–10.5)
nRBC: 0 % (ref 0.0–0.2)

## 2019-12-28 LAB — BASIC METABOLIC PANEL
Anion gap: 14 (ref 5–15)
BUN: 15 mg/dL (ref 6–20)
CO2: 27 mmol/L (ref 22–32)
Calcium: 8.9 mg/dL (ref 8.9–10.3)
Chloride: 96 mmol/L — ABNORMAL LOW (ref 98–111)
Creatinine, Ser: 0.91 mg/dL (ref 0.44–1.00)
GFR calc Af Amer: >60 mL/min (ref >60)
GFR calc non Af Amer: >60 mL/min (ref >60)
Glucose, Bld: 106 mg/dL — ABNORMAL HIGH (ref 70–99)
Potassium: 3.1 mmol/L — ABNORMAL LOW (ref 3.5–5.1)
Sodium: 137 mmol/L (ref 135–145)

## 2019-12-28 LAB — TROPONIN I (HIGH SENSITIVITY): Troponin I (High Sensitivity): <2 ng/L (ref <18)

## 2019-12-28 MED ORDER — FENTANYL CITRATE (PF) 100 MCG/2ML IJ SOLN
50.0000 ug | INTRAMUSCULAR | Status: DC | PRN
  Administered 2019-12-28: 50 ug via NASAL
  Filled 2019-12-28: qty 2

## 2019-12-28 NOTE — ED Triage Notes (Signed)
Pt arrives POV to triage with c/o right rib pain. Pt states that she fell off of her ottoman x3 days ago and fell onto her right ribcage. Pt states that she has some increased SOB due to the pain. Pt denies any other cardiac symptoms at this time.

## 2019-12-28 NOTE — ED Notes (Signed)
Pt assisted to restroom.  

## 2019-12-29 ENCOUNTER — Emergency Department
Admission: EM | Admit: 2019-12-29 | Discharge: 2019-12-29 | Disposition: A | Discharge status: Home / Self Care | Payer: Medicaid Other | Attending: Emergency Medicine | Admitting: Emergency Medicine

## 2019-12-29 DIAGNOSIS — S20211A Contusion of right front wall of thorax, initial encounter: Secondary | ICD-10-CM

## 2019-12-29 DIAGNOSIS — E876 Hypokalemia: Secondary | ICD-10-CM

## 2019-12-29 DIAGNOSIS — R0789 Other chest pain: Secondary | ICD-10-CM

## 2019-12-29 LAB — TROPONIN I (HIGH SENSITIVITY): Troponin I (High Sensitivity): 2 ng/L (ref <18)

## 2019-12-29 MED ORDER — POTASSIUM CHLORIDE CRYS ER 20 MEQ PO TBCR
40.0000 meq | EXTENDED_RELEASE_TABLET | Freq: Once | ORAL | Status: DC
  Filled 2019-12-29: qty 2

## 2019-12-29 MED ORDER — OXYCODONE-ACETAMINOPHEN 5-325 MG PO TABS: 1.0000 | ORAL_TABLET | ORAL | 0 refills | Status: DC | PRN

## 2019-12-29 MED ORDER — KETOROLAC TROMETHAMINE 60 MG/2ML IM SOLN
30.0000 mg | Freq: Once | INTRAMUSCULAR | Status: AC
  Administered 2019-12-29: 30 mg via INTRAMUSCULAR
  Filled 2019-12-29: qty 2

## 2019-12-29 MED ORDER — OXYCODONE-ACETAMINOPHEN 5-325 MG PO TABS
1.0000 | ORAL_TABLET | Freq: Once | ORAL | Status: AC
  Administered 2019-12-29: 1 via ORAL
  Filled 2019-12-29: qty 1

## 2019-12-29 NOTE — ED Provider Notes (Signed)
[facility] Emergency Department Provider Note   ____________________________________________   First MD Initiated Contact with Patient 12/29/19 0018     (approximate)  I have reviewed the triage vital signs and the nursing notes.   HISTORY  Chief Complaint Chest Pain    HPI Reniyah Gootee is a 54 y.o. female who presents to the ED from home with a chief complaint of right rib pain.  Patient with a history of COPD, not on home oxygen who fell off of her ottoman 3 days ago and fell onto her right rib cage.  Has been experiencing pain and shortness of breath since.  Denies fever, chills, cough, abdominal pain, nausea, vomiting or dizziness.      Past Medical History:  Diagnosis Date  . Acid reflux   . Asthma   . Chronic gastritis 08/06/2018  . Collapsed vertebra ([place]) 10/11/2018   lumbosacral  . COPD (chronic obstructive pulmonary disease) ([place])   . Esophagitis, reflux 08/06/2018  . Fibromyalgia   . GERD (gastroesophageal reflux disease) 10/11/2018  . Hypertension 10/11/2018  . Lumbago with sciatica, left side 10/11/2018  . Osteoporosis 10/11/2018    Patient Active Problem List   Diagnosis Date Noted  . Asthma 10/11/2018  . Collapsed vertebra ([place]) 10/11/2018  . COPD (chronic obstructive pulmonary disease) ([place]) 10/11/2018  . GERD (gastroesophageal reflux disease) 10/11/2018  . Hypertension 10/11/2018  . Lumbago with sciatica, left side 10/11/2018  . Osteoporosis 10/11/2018  . Chronic gastritis 08/06/2018  . Esophagitis, reflux 08/06/2018    Past Surgical History:  Procedure Laterality Date  . APPENDECTOMY    . CHOLECYSTECTOMY      Prior to Admission medications   Medication Sig Start Date End Date Taking? Authorizing Provider  busPIRone (BUSPAR) 7.5 MG tablet TAKE 1 TABLET (7.5 MG TOTAL) BY MOUTH 2 (TWO) TIMES DAILY FOR 90 DAYS 12/24/18   [provider]  cetirizine (ZYRTEC) 10 MG tablet Take 10 mg by mouth daily. 12/24/18   [provider]  montelukast (SINGULAIR) 10 MG tablet TAKE 1 TABLET BY MOUTH EVERY DAY AT NIGHT 12/24/18   [provider]  omeprazole (PRILOSEC) 40 MG capsule Take 40 mg by mouth 2 (two) times daily. 12/24/18   [provider]  oxybutynin (DITROPAN XL) 15 MG 24 hr tablet TAKE 1 TABLET BY MOUTH EVERY DAY 08/19/19   Hollice Espy, MD  oxyCODONE-acetaminophen (PERCOCET/ROXICET) 5-325 MG tablet Take 1 tablet by mouth every 4 (four) hours as needed for severe pain. 12/29/19   Paulette Blanch, MD  predniSONE (DELTASONE) 10 MG tablet Start 60 mg po day one, then 50 mg po day two, taper by 10 mg daily until complete. 07/16/19   Marylene Land, NP  pregabalin (LYRICA) 200 MG capsule Take 200 mg by mouth 2 (two) times daily. 01/16/19   [provider]  tiZANidine (ZANAFLEX) 4 MG tablet TAKE 1 TABLET (4 MG TOTAL) BY MOUTH 3 (THREE) TIMES DAILY AS NEEDED FOR MUSCLE SPASMS 01/12/19   [provider]  traZODone (DESYREL) 150 MG tablet TAKE 2 TABLETS (300 MG TOTAL) BY MOUTH NIGHTLY 12/29/18   [provider]  TRELEGY ELLIPTA 100-62.5-25 MCG/INH AEPB INHALE 1 INHALATION INTO THE LUNGS EVERY MORNING 12/12/18   [provider]    Allergies Patient has no known allergies.  Family History  Problem Relation Age of Onset  . Kidney disease Mother   . Heart disease Mother   . Heart attack Father     Social History Social History  Tobacco Use  . Smoking status: Current Every Day Smoker    Packs/day: 1.00  . Smokeless tobacco: Never Used  Vaping Use  . Vaping Use: Some days  Substance Use Topics  . Alcohol use: Not Currently  . Drug use: Not Currently    Review of Systems  Constitutional: No fever/chills Eyes: No visual changes. ENT: No sore throat. Cardiovascular: Positive for right rib pain. Respiratory: Positive for shortness of breath. Gastrointestinal: No abdominal pain.  No nausea, no vomiting.  No diarrhea.  No constipation. Genitourinary:  Negative for dysuria. Musculoskeletal: Negative for back pain. Skin: Negative for rash. Neurological: Negative for headaches, focal weakness or numbness.   ____________________________________________   PHYSICAL EXAM:  VITAL SIGNS: ED Triage Vitals  Enc Vitals Group     BP 12/28/19 2021 (!) 106/58     Pulse Rate 12/28/19 2021 79     Resp 12/28/19 2021 (!) 22     Temp 12/28/19 2021 98.3 F (36.8 C)     Temp Source 12/28/19 2021 Oral     SpO2 12/28/19 2021 95 %     Weight 12/28/19 2022 210 lb (95.3 kg)     Height 12/28/19 2022 5\' 2"  (1.575 m)     Head Circumference --      Peak Flow --      Pain Score 12/28/19 2022 10     Pain Loc --      Pain Edu? --      Excl. in [place]? --     Constitutional: Alert and oriented. Well appearing and in mild acute distress. Eyes: Conjunctivae are normal. PERRL. EOMI. Head: Atraumatic. Nose: No congestion/rhinnorhea. Mouth/Throat: Mucous membranes are moist.   Neck: No stridor.  No cervical spine tenderness to palpation. Cardiovascular: Normal rate, regular rhythm. Grossly normal heart sounds.  Good peripheral circulation. Respiratory: Normal respiratory effort.  No retractions. Lungs CTAB.  Mild splinting.  Right lateral lower ribs tender to palpation.  No ecchymosis.  No crepitus. Gastrointestinal: Soft and nontender to light or deep palpation, particularly in RUQ. No distention. No abdominal bruits. No CVA tenderness. Musculoskeletal: No lower extremity tenderness nor edema.  No joint effusions. Neurologic:  Normal speech and language. No gross focal neurologic deficits are appreciated. No gait instability. Skin:  Skin is warm, dry and intact. No rash noted. Psychiatric: Mood and affect are normal. Speech and behavior are normal.  ____________________________________________   LABS (all labs ordered are listed, but only abnormal results are displayed)  Labs Reviewed  BASIC METABOLIC PANEL - Abnormal; Notable for the following  components:      Result Value   Potassium 3.1 (*)    Chloride 96 (*)    Glucose, Bld 106 (*)    All other components within normal limits  CBC  TROPONIN I (HIGH SENSITIVITY)  TROPONIN I (HIGH SENSITIVITY)   ____________________________________________  EKG  ED ECG REPORT I, Marce Schartz J, the attending physician, personally viewed and interpreted this ECG.   Date: 12/29/2019  EKG Time: 2022  Rate: 76  Rhythm: normal EKG, normal sinus rhythm  Axis: Normal  Intervals:none  ST&T Change: Nonspecific  ____________________________________________  RADIOLOGY  ED MD interpretation: No visible displaced rib fracture; no pneumothorax; right base atelectasis  Official radiology report(s): DG Ribs Unilateral W/Chest Right  Result Date: 12/28/2019 CLINICAL DATA:  Fall, right rib pain EXAM: RIGHT RIBS AND CHEST - 3+ VIEW COMPARISON:  None. FINDINGS: No visible rib fracture. No effusion or pneumothorax. Heart is normal size. Right base atelectasis. IMPRESSION: No  visible displaced rib fracture. Right base atelectasis. Electronically Signed   By: Rolm Baptise M.D.   On: 12/28/2019 21:15    ____________________________________________   PROCEDURES  Procedure(s) performed (including Critical Care):  Procedures   ____________________________________________   INITIAL IMPRESSION / ASSESSMENT AND PLAN / ED COURSE  As part of my medical decision making, I reviewed the following data within the [place] notes reviewed and incorporated, Labs reviewed, EKG interpreted, Radiograph reviewed, Notes from prior ED visits and [place] Controlled Substance Database     Ivry Pigue was evaluated in Emergency Department on 12/29/2019 for the symptoms described in the history of present illness. She was evaluated in the context of the global COVID-19 pandemic, which necessitated consideration that the patient might be at risk for infection with the SARS-CoV-2 virus that  causes COVID-19. Institutional protocols and algorithms that pertain to the evaluation of patients at risk for COVID-19 are in a state of rapid change based on information released by regulatory bodies including the CDC and federal and state organizations. These policies and algorithms were followed during the patient's care in the ED.    54 year old female presenting with right rib pain status post fall. Differential diagnosis includes, but is not limited to, ACS, aortic dissection, pulmonary embolism, cardiac tamponade, pneumothorax, pneumonia, pericarditis, myocarditis, GI-related causes including esophagitis/gastritis, and musculoskeletal chest wall pain.    Laboratory results remarkable for mild hypokalemia; 2 sets of negative troponins. Will replete potassium, administer IM Toradol and oral Percocet for analgesia.  Will discharge home on Percocet and incentive spirometer with instructions for use.  Strict return precautions given.  Patient verbalizes understanding agrees with plan of care.      ____________________________________________   FINAL CLINICAL IMPRESSION(S) / ED DIAGNOSES  Final diagnoses:  Chest wall pain  Rib contusion, right, initial encounter  Hypokalemia     ED Discharge Orders         Ordered    oxyCODONE-acetaminophen (PERCOCET/ROXICET) 5-325 MG tablet  Every 4 hours PRN     Discontinue  Reprint     12/29/19 0023           Note:  This document was prepared using Dragon voice recognition software and may include unintentional dictation errors.   Paulette Blanch, MD 12/29/19 [phone]

## 2019-12-29 NOTE — Discharge Instructions (Signed)
1.  You may take Ibuprofen as needed for pain; Percocet as needed for more severe pain. 2.  Use incentive spirometer as instructed. 3.  Return to the ER for worsening symptoms, persistent vomiting, fever, difficulty breathing or other concerns.

## 2020-01-05 ENCOUNTER — Other Ambulatory Visit: Payer: Self-pay | Admitting: Urology

## 2020-01-07 ENCOUNTER — Ambulatory Visit (INDEPENDENT_AMBULATORY_CARE_PROVIDER_SITE_OTHER): Payer: Medicaid Other

## 2020-01-07 ENCOUNTER — Encounter: Payer: Self-pay | Admitting: Emergency Medicine

## 2020-01-07 ENCOUNTER — Other Ambulatory Visit: Payer: Self-pay

## 2020-01-07 ENCOUNTER — Ambulatory Visit
Admission: EM | Admit: 2020-01-07 | Discharge: 2020-01-07 | Disposition: A | Discharge status: Home / Self Care | Payer: Medicaid Other | Attending: Internal Medicine | Admitting: Internal Medicine

## 2020-01-07 DIAGNOSIS — R0789 Other chest pain: Secondary | ICD-10-CM

## 2020-01-07 MED ORDER — KETOROLAC TROMETHAMINE 60 MG/2ML IM SOLN
30.0000 mg | Freq: Once | INTRAMUSCULAR | Status: AC
  Administered 2020-01-07: 30 mg via INTRAMUSCULAR

## 2020-01-07 MED ORDER — IBUPROFEN 600 MG PO TABS
600.0000 mg | ORAL_TABLET | Freq: Four times a day (QID) | ORAL | 0 refills | Status: DC | PRN
Start: 2020-01-07 — End: 2023-01-09

## 2020-01-07 NOTE — ED Provider Notes (Signed)
[facility]    CSN: [id number] Arrival date & time: 01/07/20  1100      History   Chief Complaint Chief Complaint  Patient presents with  . Rib Injury    HPI Haley Hebert is a 54 y.o. female with history of chronic tobacco use/COPD comes to urgent care with right-sided pleuritic chest pain.  Patient had a mechanical fall on or about 12/23/2019.  She was evaluated in the emergency department on 6/26 and found to have a negative chest x-ray.  Patient comes to the urgent care with worsening right-sided pain.  Pain is constant, aggravated by taking a deep breath or coughing, no known relieving factors, patient denies any difficulty breathing.  No fever or chills.  No radiation of pain.  Patient has a cough which is productive of yellowish sputum.  She denies any wheezing. HPI  Past Medical History:  Diagnosis Date  . Acid reflux   . Asthma   . Chronic gastritis 08/06/2018  . Collapsed vertebra ([place]) 10/11/2018   lumbosacral  . COPD (chronic obstructive pulmonary disease) ([place])   . Esophagitis, reflux 08/06/2018  . Fibromyalgia   . GERD (gastroesophageal reflux disease) 10/11/2018  . Hypertension 10/11/2018  . Lumbago with sciatica, left side 10/11/2018  . Osteoporosis 10/11/2018    Patient Active Problem List   Diagnosis Date Noted  . Asthma 10/11/2018  . Collapsed vertebra ([place]) 10/11/2018  . COPD (chronic obstructive pulmonary disease) ([place]) 10/11/2018  . GERD (gastroesophageal reflux disease) 10/11/2018  . Hypertension 10/11/2018  . Lumbago with sciatica, left side 10/11/2018  . Osteoporosis 10/11/2018  . Chronic gastritis 08/06/2018  . Esophagitis, reflux 08/06/2018    Past Surgical History:  Procedure Laterality Date  . APPENDECTOMY    . CHOLECYSTECTOMY      OB History   No obstetric history on file.      Home Medications    Prior to Admission medications   Medication Sig Start Date End Date Taking? Authorizing Provider  alendronate (FOSAMAX) 70 MG  tablet Take by mouth once a week. 12/31/19  Yes [provider]  azelastine (ASTELIN) 0.1 % nasal spray Place 1 spray into both nostrils 2 (two) times daily. 12/09/19  Yes [provider]  busPIRone (BUSPAR) 7.5 MG tablet TAKE 1 TABLET (7.5 MG TOTAL) BY MOUTH 2 (TWO) TIMES DAILY FOR 90 DAYS 12/24/18  Yes [provider]  cetirizine (ZYRTEC) 10 MG tablet Take 10 mg by mouth daily. 12/24/18  Yes [provider]  escitalopram (LEXAPRO) 20 MG tablet Take 20 mg by mouth daily. 12/21/19  Yes [provider]  fluticasone (FLONASE) 50 MCG/ACT nasal spray Place 1 spray into both nostrils 2 (two) times daily. 12/21/19  Yes [provider]  hydrochlorothiazide (HYDRODIURIL) 25 MG tablet Take 25 mg by mouth every morning. Taking 1/2 tablet 01/02/20  Yes [provider]  ibuprofen (ADVIL) 800 MG tablet Take 1 tablet by mouth every 8 (eight) hours as needed. Do not take with Meloxicam 03/20/19  Yes [provider]  meloxicam (MOBIC) 15 MG tablet Take 15 mg by mouth daily as needed. 01/02/20  Yes [provider]  montelukast (SINGULAIR) 10 MG tablet TAKE 1 TABLET BY MOUTH EVERY DAY AT NIGHT 12/24/18  Yes [provider]  omeprazole (PRILOSEC) 40 MG capsule Take 40 mg by mouth 2 (two) times daily. 12/24/18  Yes [provider]  oxybutynin (DITROPAN XL) 15 MG 24 hr tablet TAKE 1 TABLET BY MOUTH EVERY DAY 08/19/19  Yes Erlene Quan,  Caryl Pina, MD  potassium chloride (MICRO-K) 10 MEQ CR capsule Take 10 mEq by mouth every morning. 01/02/20  Yes [provider]  pregabalin (LYRICA) 200 MG capsule Take 200 mg by mouth 2 (two) times daily. 01/16/19  Yes [provider]  tiZANidine (ZANAFLEX) 4 MG tablet TAKE 1 TABLET (4 MG TOTAL) BY MOUTH 3 (THREE) TIMES DAILY AS NEEDED FOR MUSCLE SPASMS 01/12/19  Yes [provider]  traZODone (DESYREL) 150 MG tablet TAKE 2 TABLETS (300 MG TOTAL) BY MOUTH NIGHTLY 12/29/18  Yes [provider]  TRELEGY ELLIPTA 100-62.5-25 MCG/INH AEPB INHALE 1 INHALATION INTO THE LUNGS EVERY MORNING 12/12/18  Yes [provider]  ibuprofen (ADVIL) 600 MG tablet Take 1 tablet (600 mg total) by mouth every 6 (six) hours as needed. 01/07/20   LampteyMyrene Galas, MD    Family History Family History  Problem Relation Age of Onset  . Kidney disease Mother   . Heart disease Mother   . Heart attack Father     Social History Social History   Tobacco Use  . Smoking status: Current Every Day Smoker    Packs/day: 1.00    Years: 20.00    Pack years: 20.00  . Smokeless tobacco: Never Used  Vaping Use  . Vaping Use: Some days  Substance Use Topics  . Alcohol use: Not Currently  . Drug use: Not Currently     Allergies   Patient has no known allergies.   Review of Systems Review of Systems  Constitutional: Negative for fatigue and fever.  HENT: Negative.   Eyes: Negative.   Respiratory: Positive for cough. Negative for chest tightness, shortness of breath and wheezing.   Cardiovascular: Positive for chest pain. Negative for palpitations.  Gastrointestinal: Negative.  Negative for nausea and vomiting.  Skin: Negative.   Neurological: Negative.      Physical Exam Triage Vital Signs ED Triage Vitals  Enc Vitals Group     BP 01/07/20 1153 103/66     Pulse Rate 01/07/20 1153 (!) 51     Resp 01/07/20 1153 18     Temp 01/07/20 1153 98.1 F (36.7 C)     Temp Source 01/07/20 1153 Oral     SpO2 01/07/20 1153 96 %     Weight 01/07/20 1154 209 lb (94.8 kg)     Height 01/07/20 1154 5\' 2"  (1.575 m)     Head Circumference --      Peak Flow --      Pain Score 01/07/20 1153 7     Pain Loc --      Pain Edu? --      Excl. in [place]? --    No data found.  Updated Vital Signs BP 103/66 (BP Location: Left Arm)   Pulse (!) 51   Temp 98.1 F (36.7 C) (Oral)   Resp 18   Ht 5\' 2"  (1.575 m)   Wt 94.8 kg   SpO2 96%   BMI 38.23 kg/m   Visual Acuity Right Eye Distance:     Left Eye Distance:   Bilateral Distance:    Right Eye Near:   Left Eye Near:    Bilateral Near:     Physical Exam Vitals and nursing note reviewed.  Constitutional:      General: She is in acute distress.     Appearance: She is ill-appearing.  HENT:     Right Ear: Tympanic membrane normal.     Left Ear: Tympanic membrane normal.  Cardiovascular:  Rate and Rhythm: Normal rate and regular rhythm.     Pulses: Normal pulses.     Heart sounds: No murmur heard.  No friction rub.  Pulmonary:     Effort: Pulmonary effort is normal. No respiratory distress.     Breath sounds: No stridor. Rhonchi present. No rales.  Chest:     Chest wall: No tenderness.  Abdominal:     General: Abdomen is flat.     Palpations: Abdomen is soft.  Musculoskeletal:        General: Normal range of motion.     Cervical back: Normal range of motion. No rigidity or tenderness.  Lymphadenopathy:     Cervical: No cervical adenopathy.  Skin:    Capillary Refill: Capillary refill takes less than 2 seconds.  Neurological:     Mental Status: She is alert.      UC Treatments / Results  Labs (all labs ordered are listed, but only abnormal results are displayed) Labs Reviewed - No data to display  EKG   Radiology DG Chest 2 View  Result Date: 01/07/2020 CLINICAL DATA:  Chest pain.  Fall approximately 1 month prior EXAM: CHEST - 2 VIEW COMPARISON:  December 28, 2019 FINDINGS: The lungs are clear. The heart size and pulmonary vascularity are normal. No adenopathy. No pneumothorax. No appreciable bone lesions. IMPRESSION: No edema or airspace opacity.  Stable cardiac silhouette. Electronically Signed   By: Lowella Grip III M.D.   On: 01/07/2020 13:14    Procedures Procedures (including critical care time)  Medications Ordered in UC Medications  ketorolac (TORADOL) injection 30 mg (30 mg Intramuscular Given 01/07/20 1251)    Initial Impression / Assessment and Plan / UC Course  I have reviewed the  triage vital signs and the nursing notes.  Pertinent labs & imaging results that were available during my care of the patient were reviewed by me and considered in my medical decision making (see chart for details).     1.  Chest wall pain/pleurisy: Chest x-ray is negative for acute lung infiltrate Ibuprofen 600 mg every 6 hours as needed for pain Toradol 30 mg IM x1 dose Deep breathing exercises encouraged Heating pad use is encouraged If patient symptoms are persistent she needs to follow-up with primary care physician to be reevaluated. Final Clinical Impressions(s) / UC Diagnoses   Final diagnoses:  Chest wall pain   Discharge Instructions   None    ED Prescriptions    Medication Sig Dispense Auth. Provider   ibuprofen (ADVIL) 600 MG tablet Take 1 tablet (600 mg total) by mouth every 6 (six) hours as needed. 30 tablet [place] Gauer, Myrene Galas, MD     I have reviewed the PDMP during this encounter.   Chase Picket, MD 01/07/20 [phone]

## 2020-01-07 NOTE — ED Triage Notes (Signed)
Patient in today c/o continued rib pain x 9-10 days. Patient seen in ED for same on 12/29/19. Patient was prescribed Percocet for pain.

## 2020-01-27 ENCOUNTER — Other Ambulatory Visit: Payer: Self-pay | Admitting: Urology

## 2020-02-19 ENCOUNTER — Other Ambulatory Visit: Payer: Self-pay | Admitting: Urology

## 2020-02-27 ENCOUNTER — Ambulatory Visit
Admission: EM | Admit: 2020-02-27 | Discharge: 2020-02-27 | Disposition: A | Discharge status: Home / Self Care | Payer: Medicaid Other | Attending: Family Medicine | Admitting: Family Medicine

## 2020-02-27 ENCOUNTER — Other Ambulatory Visit: Payer: Self-pay

## 2020-02-27 DIAGNOSIS — Z20822 Contact with and (suspected) exposure to covid-19: Secondary | ICD-10-CM

## 2020-02-27 NOTE — ED Triage Notes (Signed)
Patient in today after have a +covid exposure over the weekend at a wedding. Patient denies any symptoms.

## 2020-02-27 NOTE — Discharge Instructions (Signed)

## 2020-02-28 LAB — SARS CORONAVIRUS 2 (TAT 6-24 HRS): SARS Coronavirus 2: NEGATIVE

## 2020-03-21 ENCOUNTER — Other Ambulatory Visit: Payer: Self-pay | Admitting: Urology

## 2020-06-08 ENCOUNTER — Other Ambulatory Visit: Payer: Self-pay | Admitting: Urology

## 2020-07-29 ENCOUNTER — Other Ambulatory Visit: Payer: Self-pay | Admitting: Urology

## 2020-10-28 ENCOUNTER — Other Ambulatory Visit: Payer: Self-pay

## 2020-10-28 ENCOUNTER — Emergency Department
Admission: EM | Admit: 2020-10-28 | Discharge: 2020-10-28 | Disposition: A | Discharge status: Home / Self Care | Payer: Medicaid Other | Attending: Emergency Medicine | Admitting: Emergency Medicine

## 2020-10-28 ENCOUNTER — Encounter: Payer: Self-pay | Admitting: Emergency Medicine

## 2020-10-28 DIAGNOSIS — Z7951 Long term (current) use of inhaled steroids: Secondary | ICD-10-CM | POA: Insufficient documentation

## 2020-10-28 DIAGNOSIS — I10 Essential (primary) hypertension: Secondary | ICD-10-CM | POA: Diagnosis not present

## 2020-10-28 DIAGNOSIS — F17200 Nicotine dependence, unspecified, uncomplicated: Secondary | ICD-10-CM | POA: Diagnosis not present

## 2020-10-28 DIAGNOSIS — Z79899 Other long term (current) drug therapy: Secondary | ICD-10-CM | POA: Diagnosis not present

## 2020-10-28 DIAGNOSIS — J449 Chronic obstructive pulmonary disease, unspecified: Secondary | ICD-10-CM | POA: Insufficient documentation

## 2020-10-28 DIAGNOSIS — R1310 Dysphagia, unspecified: Secondary | ICD-10-CM | POA: Diagnosis present

## 2020-10-28 DIAGNOSIS — J45909 Unspecified asthma, uncomplicated: Secondary | ICD-10-CM | POA: Diagnosis not present

## 2020-10-28 DIAGNOSIS — C159 Malignant neoplasm of esophagus, unspecified: Secondary | ICD-10-CM | POA: Insufficient documentation

## 2020-10-28 LAB — COMPREHENSIVE METABOLIC PANEL
ALT: 15 U/L (ref 0–44)
AST: 30 U/L (ref 15–41)
Albumin: 3.4 g/dL — ABNORMAL LOW (ref 3.5–5.0)
Alkaline Phosphatase: 44 U/L (ref 38–126)
Anion gap: 10 (ref 5–15)
BUN: 18 mg/dL (ref 6–20)
CO2: 27 mmol/L (ref 22–32)
Calcium: 9.4 mg/dL (ref 8.9–10.3)
Chloride: 100 mmol/L (ref 98–111)
Creatinine, Ser: 0.6 mg/dL (ref 0.44–1.00)
GFR, Estimated: >60 mL/min (ref >60)
Glucose, Bld: 98 mg/dL (ref 70–99)
Potassium: 3.2 mmol/L — ABNORMAL LOW (ref 3.5–5.1)
Sodium: 137 mmol/L (ref 135–145)
Total Bilirubin: 0.7 mg/dL (ref 0.3–1.2)
Total Protein: 6.6 g/dL (ref 6.5–8.1)

## 2020-10-28 LAB — CBC WITH DIFFERENTIAL/PLATELET
Abs Immature Granulocytes: 0.03 10*3/uL (ref 0.00–0.07)
Basophils Absolute: 0 10*3/uL (ref 0.0–0.1)
Basophils Relative: 0 %
Eosinophils Absolute: 0 10*3/uL (ref 0.0–0.5)
Eosinophils Relative: 0 %
HCT: 32.7 % — ABNORMAL LOW (ref 36.0–46.0)
Hemoglobin: 11.3 g/dL — ABNORMAL LOW (ref 12.0–15.0)
Immature Granulocytes: 1 %
Lymphocytes Relative: 25 %
Lymphs Abs: 1 10*3/uL (ref 0.7–4.0)
MCH: 32.4 pg (ref 26.0–34.0)
MCHC: 34.6 g/dL (ref 30.0–36.0)
MCV: 93.7 fL (ref 80.0–100.0)
Monocytes Absolute: 0.5 10*3/uL (ref 0.1–1.0)
Monocytes Relative: 12 %
Neutro Abs: 2.5 10*3/uL (ref 1.7–7.7)
Neutrophils Relative %: 62 %
Platelets: 154 10*3/uL (ref 150–400)
RBC: 3.49 MIL/uL — ABNORMAL LOW (ref 3.87–5.11)
RDW: 15.5 % (ref 11.5–15.5)
WBC: 4.1 10*3/uL (ref 4.0–10.5)
nRBC: 0 % (ref 0.0–0.2)

## 2020-10-28 LAB — LIPASE, BLOOD: Lipase: 53 U/L — ABNORMAL HIGH (ref 11–51)

## 2020-10-28 MED ORDER — POTASSIUM CHLORIDE CRYS ER 20 MEQ PO TBCR
40.0000 meq | EXTENDED_RELEASE_TABLET | Freq: Once | ORAL | Status: AC
  Administered 2020-10-28: 40 meq via ORAL

## 2020-10-28 MED ORDER — LACTATED RINGERS IV BOLUS
1000.0000 mL | Freq: Once | INTRAVENOUS | Status: AC
  Administered 2020-10-28: 1000 mL via INTRAVENOUS

## 2020-10-28 MED ORDER — LIDOCAINE VISCOUS HCL 2 % MT SOLN: 15.0000 mL | OROMUCOSAL | 1 refills | Status: DC | PRN

## 2020-10-28 MED ORDER — LIDOCAINE VISCOUS HCL 2 % MT SOLN
15.0000 mL | Freq: Once | OROMUCOSAL | Status: AC
  Administered 2020-10-28: 15 mL via ORAL
  Filled 2020-10-28: qty 15

## 2020-10-28 MED ORDER — ONDANSETRON HCL 4 MG/2ML IJ SOLN
4.0000 mg | Freq: Once | INTRAMUSCULAR | Status: AC
  Administered 2020-10-28: 4 mg via INTRAVENOUS
  Filled 2020-10-28: qty 2

## 2020-10-28 MED ORDER — ALUM & MAG HYDROXIDE-SIMETH 200-200-20 MG/5ML PO SUSP
15.0000 mL | Freq: Once | ORAL | Status: AC
  Administered 2020-10-28: 15 mL via ORAL
  Filled 2020-10-28: qty 30

## 2020-10-28 NOTE — ED Triage Notes (Signed)
Pt states dx with esophageal cancer 2 months ago, states finished radiation 3 treatment 3 weeks ago and finished chemo prior to that. Pt states has been unable to eat due to pain in her throat. Pt states feels dehydrated at this time but was unable to follow up due to not having the money.

## 2020-10-28 NOTE — ED Provider Notes (Signed)
[facility] Emergency Department Provider Note   ____________________________________________   Event Date/Time   First MD Initiated Contact with Patient 10/28/20 0830     (approximate)  I have reviewed the triage vital signs and the nursing notes.   HISTORY  Chief Complaint Decreased PO Intake    HPI Haley Hebert is a 55 y.o. female with past medical history of hypertension, COPD, and esophageal cancer who presents to the ED complaining of odynophagia.  Patient reports that she completed radiation of her esophagus along with chemotherapy 3 weeks ago, currently follows at [facility] for her cancer care.  Since then, she has had worsening pain in her throat when swallowing along with difficulty keeping down solid foods.  She has been able to keep down liquids and Ensure, but has significant pain when swallowing these.  When she tries to eat solids, she also has pain and then will vomit the food back up.  She has not had any fevers, abdominal pain, or changes in her bowel movements.  She was previously mostly prescribed Magic mouthwash but states she has been unable to afford this recently.        Past Medical History:  Diagnosis Date  . Acid reflux   . Asthma   . Chronic gastritis 08/06/2018  . Collapsed vertebra ([place]) 10/11/2018   lumbosacral  . COPD (chronic obstructive pulmonary disease) ([place])   . Esophagitis, reflux 08/06/2018  . Fibromyalgia   . GERD (gastroesophageal reflux disease) 10/11/2018  . Hypertension 10/11/2018  . Lumbago with sciatica, left side 10/11/2018  . Osteoporosis 10/11/2018    Patient Active Problem List   Diagnosis Date Noted  . Asthma 10/11/2018  . Collapsed vertebra ([place]) 10/11/2018  . COPD (chronic obstructive pulmonary disease) ([place]) 10/11/2018  . GERD (gastroesophageal reflux disease) 10/11/2018  . Hypertension 10/11/2018  . Lumbago with sciatica, left side 10/11/2018  . Osteoporosis 10/11/2018  . Chronic gastritis 08/06/2018   . Esophagitis, reflux 08/06/2018    Past Surgical History:  Procedure Laterality Date  . APPENDECTOMY    . CHOLECYSTECTOMY      Prior to Admission medications   Medication Sig Start Date End Date Taking? Authorizing Provider  lidocaine (XYLOCAINE) 2 % solution Use as directed 15 mLs in the mouth or throat as needed for mouth pain. 10/28/20  Yes Blake Divine, MD  alendronate (FOSAMAX) 70 MG tablet Take by mouth once a week. 12/31/19   [provider]  azelastine (ASTELIN) 0.1 % nasal spray Place 1 spray into both nostrils 2 (two) times daily. 12/09/19   [provider]  busPIRone (BUSPAR) 7.5 MG tablet TAKE 1 TABLET (7.5 MG TOTAL) BY MOUTH 2 (TWO) TIMES DAILY FOR 90 DAYS 12/24/18   [provider]  cetirizine (ZYRTEC) 10 MG tablet Take 10 mg by mouth daily. 12/24/18   [provider]  escitalopram (LEXAPRO) 20 MG tablet Take 20 mg by mouth daily. 12/21/19   [provider]  fluticasone (FLONASE) 50 MCG/ACT nasal spray Place 1 spray into both nostrils 2 (two) times daily. 12/21/19   [provider]  hydrochlorothiazide (HYDRODIURIL) 25 MG tablet Take 25 mg by mouth every morning. Taking 1/2 tablet 01/02/20   [provider]  ibuprofen (ADVIL) 600 MG tablet Take 1 tablet (600 mg total) by mouth every 6 (six) hours as needed. 01/07/20   Chase Picket, MD  ibuprofen (ADVIL) 800 MG tablet Take 1 tablet by mouth every 8 (eight) hours as needed. Do not take with Meloxicam  03/20/19   [provider]  meloxicam (MOBIC) 15 MG tablet Take 15 mg by mouth daily as needed. 01/02/20   [provider]  montelukast (SINGULAIR) 10 MG tablet TAKE 1 TABLET BY MOUTH EVERY DAY AT NIGHT 12/24/18   [provider]  omeprazole (PRILOSEC) 40 MG capsule Take 40 mg by mouth 2 (two) times daily. 12/24/18   [provider]  oxybutynin (DITROPAN XL) 15 MG 24 hr tablet TAKE 1 TABLET BY MOUTH EVERY DAY 02/19/20   McGowan, Larene [place] A, PA-C   potassium chloride (MICRO-K) 10 MEQ CR capsule Take 10 mEq by mouth every morning. 01/02/20   [provider]  pregabalin (LYRICA) 200 MG capsule Take 200 mg by mouth 2 (two) times daily. 01/16/19   [provider]  tiZANidine (ZANAFLEX) 4 MG tablet TAKE 1 TABLET (4 MG TOTAL) BY MOUTH 3 (THREE) TIMES DAILY AS NEEDED FOR MUSCLE SPASMS 01/12/19   [provider]  traZODone (DESYREL) 150 MG tablet TAKE 2 TABLETS (300 MG TOTAL) BY MOUTH NIGHTLY 12/29/18   [provider]  TRELEGY ELLIPTA 100-62.5-25 MCG/INH AEPB INHALE 1 INHALATION INTO THE LUNGS EVERY MORNING 12/12/18   [provider]    Allergies Patient has no known allergies.  Family History  Problem Relation Age of Onset  . Kidney disease Mother   . Heart disease Mother   . Heart attack Father     Social History Social History   Tobacco Use  . Smoking status: Current Every Day Smoker    Packs/day: 1.00    Years: 20.00    Pack years: 20.00  . Smokeless tobacco: Never Used  Vaping Use  . Vaping Use: Some days  Substance Use Topics  . Alcohol use: Not Currently  . Drug use: Not Currently    Review of Systems  Constitutional: No fever/chills Eyes: No visual changes. ENT: No sore throat.  Positive for odynophagia. Cardiovascular: Denies chest pain. Respiratory: Denies shortness of breath. Gastrointestinal: No abdominal pain.  Positive for nausea and vomiting.  No diarrhea.  No constipation. Genitourinary: Negative for dysuria. Musculoskeletal: Negative for back pain. Skin: Negative for rash. Neurological: Negative for headaches, focal weakness or numbness.  ____________________________________________   PHYSICAL EXAM:  VITAL SIGNS: ED Triage Vitals  Enc Vitals Group     BP 10/28/20 0820 97/64     Pulse Rate 10/28/20 0820 60     Resp 10/28/20 0820 20     Temp 10/28/20 0820 99.1 F (37.3 C)     Temp Source 10/28/20 0820 Oral     SpO2 10/28/20 0820 94 %     Weight  10/28/20 0818 157 lb (71.2 kg)     Height 10/28/20 0818 5\' 2"  (1.575 m)     Head Circumference --      Peak Flow --      Pain Score 10/28/20 0818 7     Pain Loc --      Pain Edu? --      Excl. in [place]? --     Constitutional: Alert and oriented. Eyes: Conjunctivae are normal. Head: Atraumatic. Nose: No congestion/rhinnorhea. Mouth/Throat: Mucous membranes are dry. Neck: Normal ROM Cardiovascular: Normal rate, regular rhythm. Grossly normal heart sounds. Respiratory: Normal respiratory effort.  No retractions. Lungs CTAB. Gastrointestinal: Soft and nontender. No distention. Genitourinary: deferred Musculoskeletal: No lower extremity tenderness nor edema. Neurologic:  Normal speech and language. No gross focal neurologic deficits are appreciated. Skin:  Skin is warm, dry and intact. No rash noted. Psychiatric: Mood  and affect are normal. Speech and behavior are normal.  ____________________________________________   LABS (all labs ordered are listed, but only abnormal results are displayed)  Labs Reviewed  CBC WITH DIFFERENTIAL/PLATELET - Abnormal; Notable for the following components:      Result Value   RBC 3.49 (*)    Hemoglobin 11.3 (*)    HCT 32.7 (*)    All other components within normal limits  COMPREHENSIVE METABOLIC PANEL - Abnormal; Notable for the following components:   Potassium 3.2 (*)    Albumin 3.4 (*)    All other components within normal limits  LIPASE, BLOOD - Abnormal; Notable for the following components:   Lipase 53 (*)    All other components within normal limits     PROCEDURES  Procedure(s) performed (including Critical Care):  Procedures   ____________________________________________   INITIAL IMPRESSION / ASSESSMENT AND PLAN / ED COURSE       55 year old female with past medical history of hypertension, COPD, and esophageal cancer who presents to the ED with increasing difficulty swallowing and pain when doing so over the past week.   She does appear dehydrated, we will check electrolytes and hydrate with IV fluids.  We will treat symptomatically with IV Zofran and a GI cocktail, suspect her symptoms are related to esophagitis secondary to radiation.  Patient reports feeling much better following Zofran and GI cocktail.  Labs remarkable for mild hypokalemia, which we will replete.  She has been able to tolerate p.o. at home and is appropriate for discharge with outpatient follow-up with both oncology and GI at [facility].  She was counseled to return to the ED for new or worsening symptoms, patient agrees with plan.      ____________________________________________   FINAL CLINICAL IMPRESSION(S) / ED DIAGNOSES  Final diagnoses:  Odynophagia  Malignant neoplasm of esophagus, unspecified location [facility])     ED Discharge Orders         Ordered    lidocaine (XYLOCAINE) 2 % solution  As needed        10/28/20 0950           Note:  This document was prepared using Dragon voice recognition software and may include unintentional dictation errors.   Blake Divine, MD 10/28/20 [phone]

## 2022-03-30 IMAGING — CR DG CHEST 2V
2 series · 2 of 2 positions shown · non-contrast
Comparison: December 28, 2019

CLINICAL DATA: Chest pain.  Fall approximately 1 month prior

EXAM:
CHEST - 2 VIEW

[chest pa]
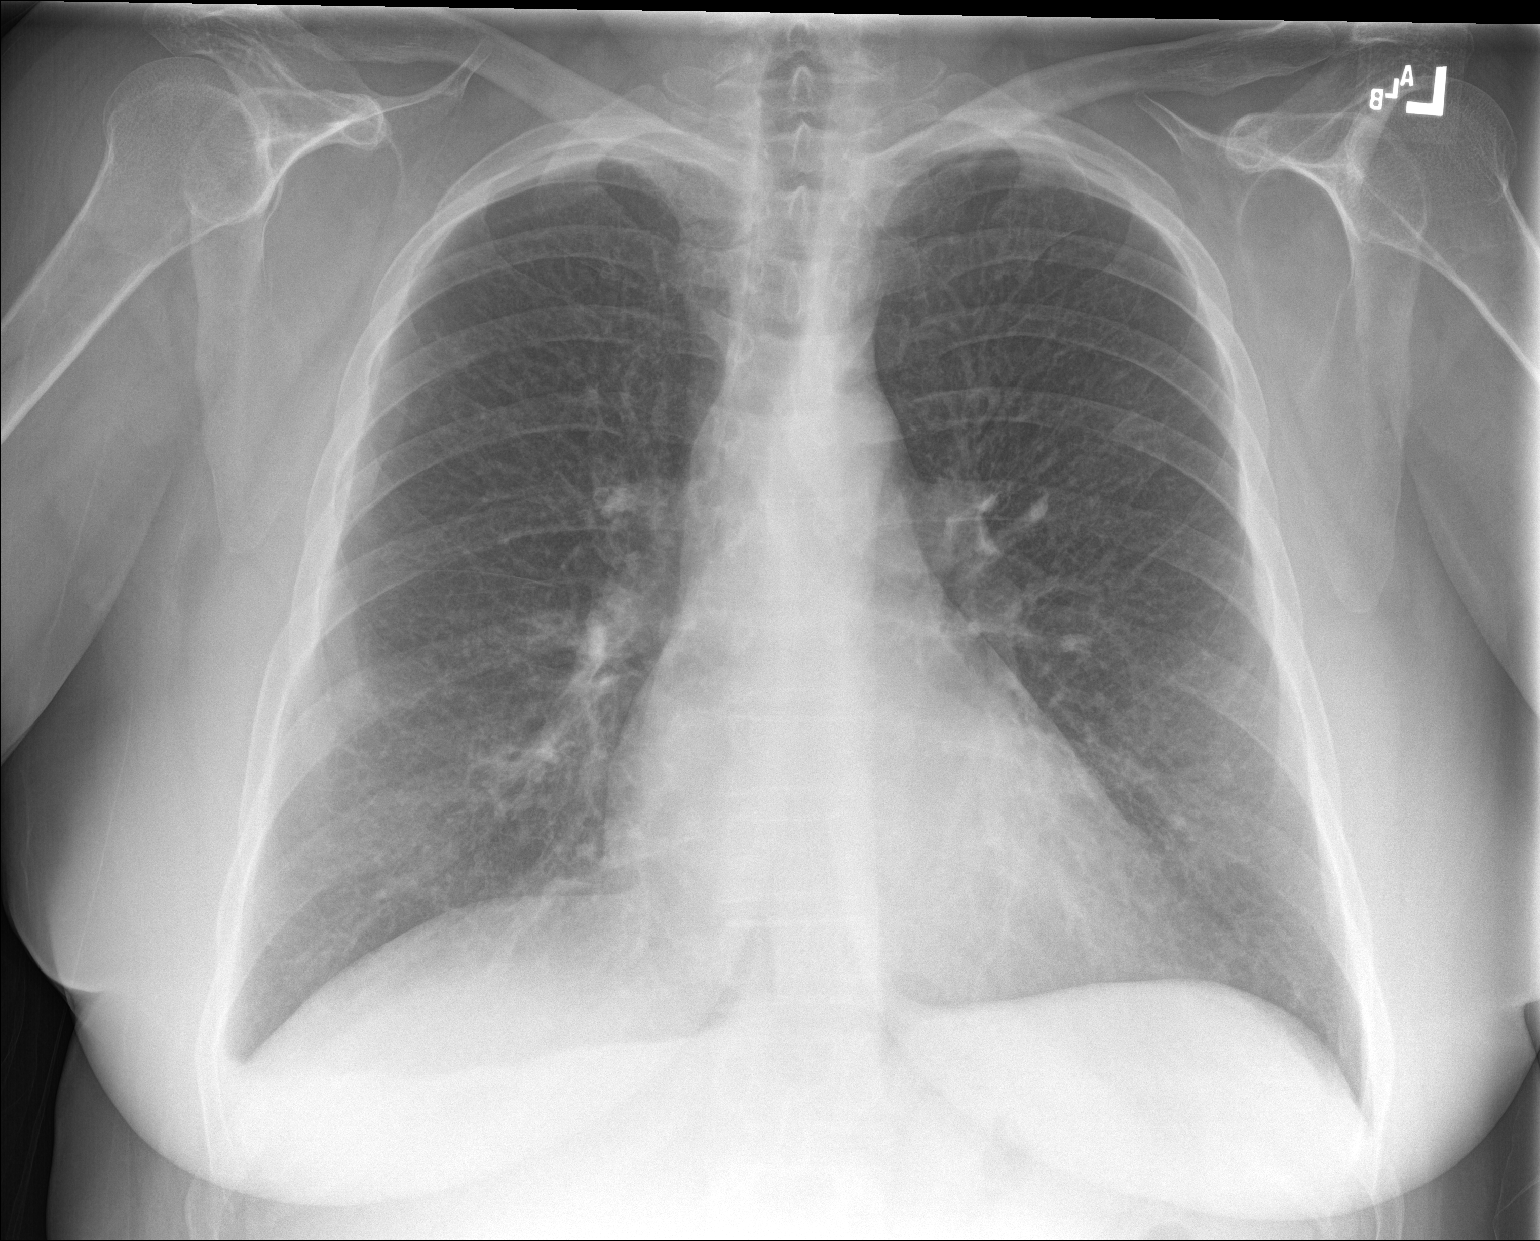

[chest lat]
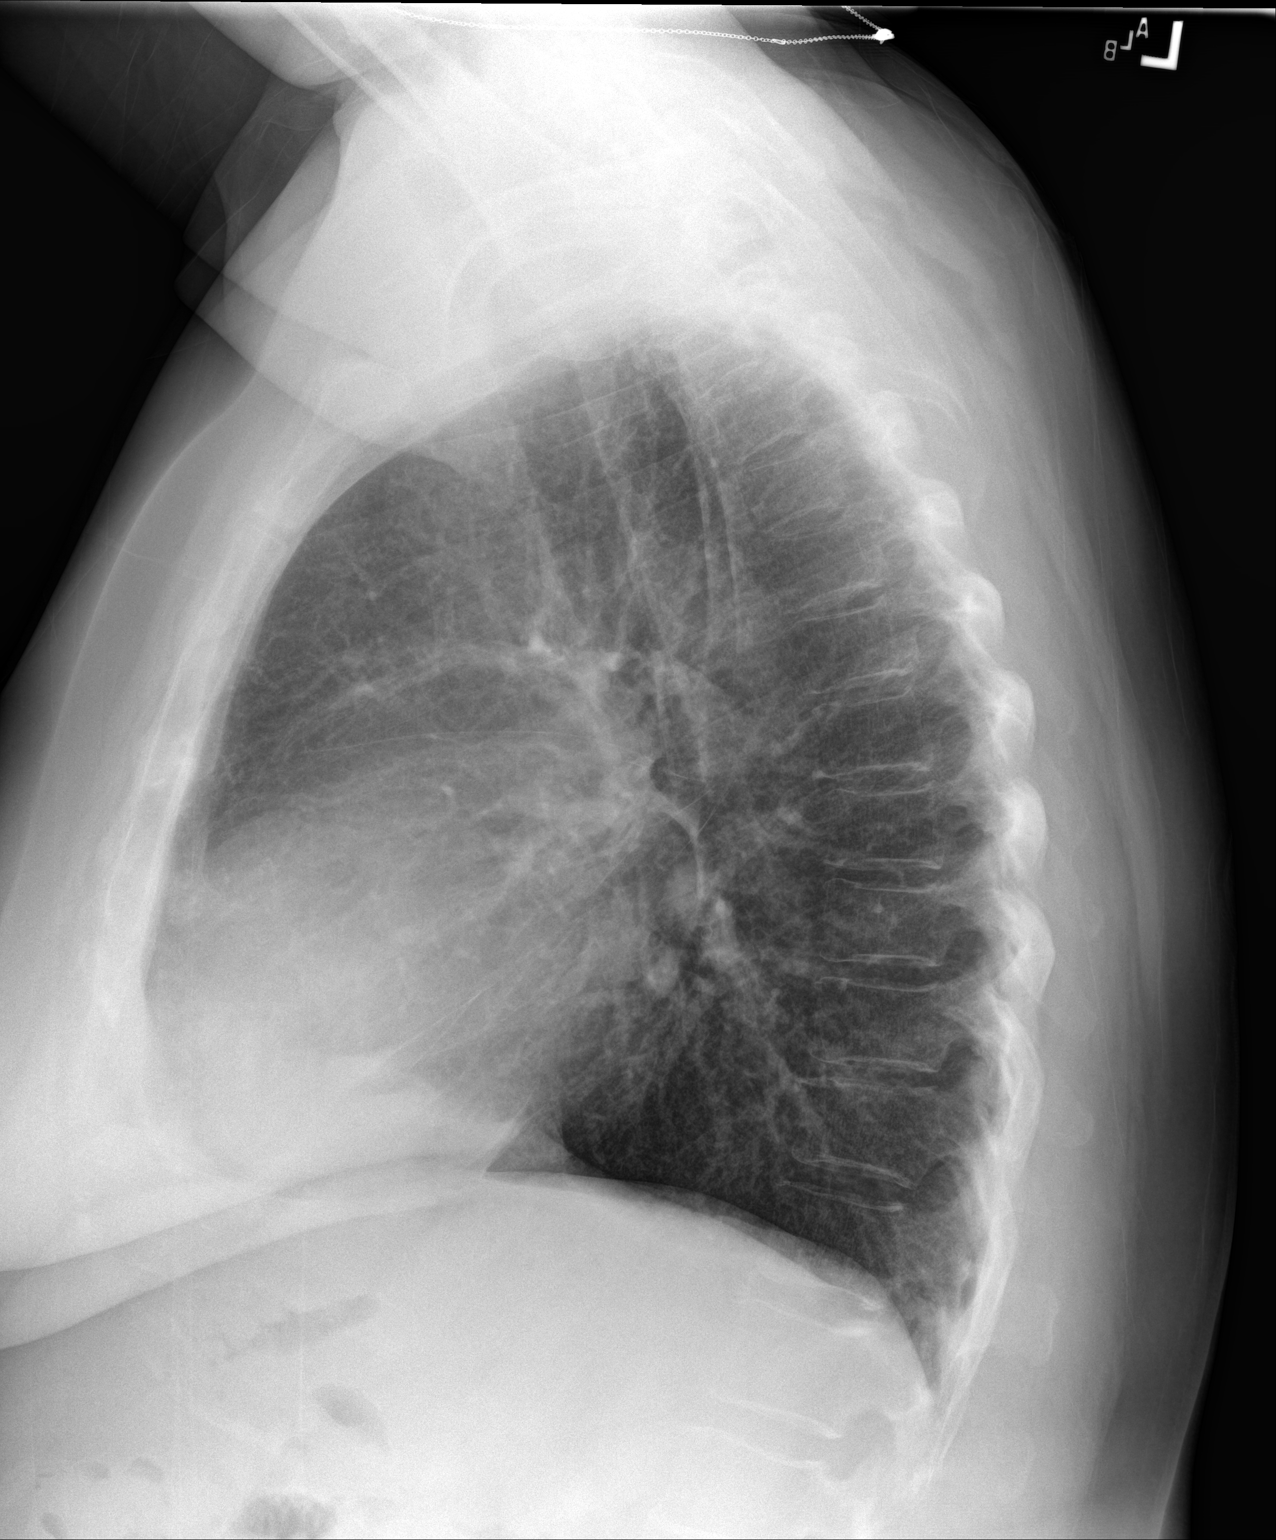

[2 of 2 positions shown; findings below may reference images not displayed]

FINDINGS: The lungs are clear. The heart size and pulmonary vascularity are
normal. No adenopathy. No pneumothorax. No appreciable bone lesions.
IMPRESSION: No edema or airspace opacity.  Stable cardiac silhouette.

## 2022-04-06 ENCOUNTER — Emergency Department
Admission: EM | Admit: 2022-04-06 | Discharge: 2022-04-06 | Disposition: A | Discharge status: Home / Self Care | Payer: Medicaid Other | Attending: Emergency Medicine | Admitting: Emergency Medicine

## 2022-04-06 ENCOUNTER — Emergency Department: Payer: Medicaid Other

## 2022-04-06 ENCOUNTER — Other Ambulatory Visit: Payer: Self-pay

## 2022-04-06 DIAGNOSIS — T65894A Toxic effect of other specified substances, undetermined, initial encounter: Secondary | ICD-10-CM | POA: Insufficient documentation

## 2022-04-06 DIAGNOSIS — K521 Toxic gastroenteritis and colitis: Secondary | ICD-10-CM | POA: Insufficient documentation

## 2022-04-06 DIAGNOSIS — K922 Gastrointestinal hemorrhage, unspecified: Secondary | ICD-10-CM | POA: Diagnosis present

## 2022-04-06 DIAGNOSIS — Z8501 Personal history of malignant neoplasm of esophagus: Secondary | ICD-10-CM | POA: Diagnosis not present

## 2022-04-06 DIAGNOSIS — R197 Diarrhea, unspecified: Secondary | ICD-10-CM

## 2022-04-06 LAB — COMPREHENSIVE METABOLIC PANEL
ALT: 32 U/L (ref 0–44)
AST: 18 U/L (ref 15–41)
Albumin: 3 g/dL — ABNORMAL LOW (ref 3.5–5.0)
Alkaline Phosphatase: 80 U/L (ref 38–126)
Anion gap: 8 (ref 5–15)
BUN: 11 mg/dL (ref 6–20)
CO2: 27 mmol/L (ref 22–32)
Calcium: 8.7 mg/dL — ABNORMAL LOW (ref 8.9–10.3)
Chloride: 104 mmol/L (ref 98–111)
Creatinine, Ser: 0.54 mg/dL (ref 0.44–1.00)
GFR, Estimated: >60 mL/min (ref >60)
Glucose, Bld: 103 mg/dL — ABNORMAL HIGH (ref 70–99)
Potassium: 3.4 mmol/L — ABNORMAL LOW (ref 3.5–5.1)
Sodium: 139 mmol/L (ref 135–145)
Total Bilirubin: 0.6 mg/dL (ref 0.3–1.2)
Total Protein: 6.5 g/dL (ref 6.5–8.1)

## 2022-04-06 LAB — CBC
HCT: 32.4 % — ABNORMAL LOW (ref 36.0–46.0)
Hemoglobin: 10.5 g/dL — ABNORMAL LOW (ref 12.0–15.0)
MCH: 27.3 pg (ref 26.0–34.0)
MCHC: 32.4 g/dL (ref 30.0–36.0)
MCV: 84.2 fL (ref 80.0–100.0)
Platelets: 267 10*3/uL (ref 150–400)
RBC: 3.85 MIL/uL — ABNORMAL LOW (ref 3.87–5.11)
RDW: 14.6 % (ref 11.5–15.5)
WBC: 8.4 10*3/uL (ref 4.0–10.5)
nRBC: 0 % (ref 0.0–0.2)

## 2022-04-06 LAB — TYPE AND SCREEN
ABO/RH(D): A NEG
Antibody Screen: NEGATIVE

## 2022-04-06 LAB — POC URINE PREG, ED: Preg Test, Ur: NEGATIVE

## 2022-04-06 MED ORDER — HEPARIN SOD (PORK) LOCK FLUSH 100 UNIT/ML IV SOLN
500.0000 [IU] | Freq: Once | INTRAVENOUS | Status: AC
  Administered 2022-04-06: 500 [IU] via INTRAVENOUS
  Filled 2022-04-06: qty 5

## 2022-04-06 MED ORDER — ONDANSETRON 8 MG PO TBDP: 8.0000 mg | ORAL_TABLET | Freq: Three times a day (TID) | ORAL | 0 refills | Status: DC | PRN

## 2022-04-06 MED ORDER — IOHEXOL 350 MG/ML SOLN
100.0000 mL | Freq: Once | INTRAVENOUS | Status: AC | PRN
  Administered 2022-04-06: 100 mL via INTRAVENOUS

## 2022-04-06 NOTE — ED Notes (Signed)
Patient transported to CT 

## 2022-04-06 NOTE — ED Provider Notes (Signed)
[facility] Provider Note   Event Date/Time   First MD Initiated Contact with Patient 04/06/22 1715     (approximate) History  GI Bleeding  HPI Haley Hebert is a 56 y.o. 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  Patient states that she has been taking Pepto-Bismol and Imodium with good results however she is concerned as she started having black stools.  Patient states that she is also attempting to increase her hydration but it is causing mild nausea.  Patient denies any orthostatic lightheadedness ROS: Patient currently denies any vision changes, tinnitus, difficulty speaking, facial droop, sore throat, chest pain, shortness of breath, abdominal pain, vomiting, dysuria, or weakness/numbness/paresthesias in any extremity   Physical Exam  Triage Vital Signs: ED Triage Vitals  Enc Vitals Group     BP 04/06/22 1708 110/68     Pulse Rate 04/06/22 1708 73     Resp 04/06/22 1708 16     Temp 04/06/22 1708 97.8 F (36.6 C)     Temp Source 04/06/22 1708 Axillary     SpO2 04/06/22 1708 97 %     Weight 04/06/22 1709 110 lb (49.9 kg)     Height 04/06/22 1709 '5\' 2"'$  (1.575 m)     Head Circumference --      Peak Flow --      Pain Score 04/06/22 1709 3     Pain Loc --      Pain Edu? --      Excl. in [place]? --    Most recent vital signs: Vitals:   04/06/22 1830 04/06/22 1900  BP: 111/69 101/71  Pulse: (!) 58 (!) 57  Resp: 17 17  Temp:    SpO2: 97% 96%   General: Awake, oriented x4. CV:  Good peripheral perfusion.  Resp:  Normal effort.  Abd:  No distention.  Other:  Middle-aged Caucasian female laying in bed in no acute distress.  Rectal exam shows no gross blood and fecal occult negative ED Results / Procedures / Treatments  Labs (all labs ordered are listed, but only abnormal results are displayed) Labs Reviewed   COMPREHENSIVE METABOLIC PANEL - Abnormal; Notable for the following components:      Result Value   Potassium 3.4 (*)    Glucose, Bld 103 (*)    Calcium 8.7 (*)    Albumin 3.0 (*)    All other components within normal limits  CBC - Abnormal; Notable for the following components:   RBC 3.85 (*)    Hemoglobin 10.5 (*)    HCT 32.4 (*)    All other components within normal limits  POC OCCULT BLOOD, ED  POC URINE PREG, ED  TYPE AND SCREEN  RADIOLOGY ED MD interpretation: CT angiography of the abdomen and pelvis interpreted by me and shows no active gastrointestinal bleeding however patient is likely status post gastric pull-up/pull-through procedure.  There is also evidence of left retroperitoneal lymphadenopathy worrisome for metastatic disease as well as trace free fluid in the pelvis.  There is also patchy groundglass opacities in the left lower lobe and mild intra and extrahepatic biliary dilation -Agree with radiology assessment Official radiology report(s): CT ANGIO GI BLEED  Result Date: 04/06/2022 CLINICAL DATA:  Dark stool. History of esophageal cancer. Diarrhea. EXAM: CTA ABDOMEN AND PELVIS WITHOUT AND WITH CONTRAST TECHNIQUE: Multidetector CT imaging of the abdomen and pelvis was performed using the  standard protocol during bolus administration of intravenous contrast. Multiplanar reconstructed images and MIPs were obtained and reviewed to evaluate the vascular anatomy. RADIATION DOSE REDUCTION: This exam was performed according to the departmental dose-optimization program which includes automated exposure control, adjustment of the mA and/or kV according to patient size and/or use of iterative reconstruction technique. CONTRAST:  119m OMNIPAQUE IOHEXOL 350 MG/ML SOLN COMPARISON:  None Available. FINDINGS: VASCULAR Aorta: Normal caliber aorta without aneurysm, dissection, vasculitis or significant stenosis. Celiac: Patent without evidence of aneurysm, dissection, vasculitis or  significant stenosis. SMA: Patent without evidence of aneurysm, dissection, vasculitis or significant stenosis. Renals: Both renal arteries are patent without evidence of aneurysm, dissection, vasculitis, fibromuscular dysplasia or significant stenosis. IMA: Patent without evidence of aneurysm, dissection, vasculitis or significant stenosis. Inflow: Patent without evidence of aneurysm, dissection, vasculitis or significant stenosis. Proximal Outflow: Bilateral common femoral and visualized portions of the superficial and profunda femoral arteries are patent without evidence of aneurysm, dissection, vasculitis or significant stenosis. Veins: No obvious venous abnormality within the limitations of this arterial phase study. Review of the MIP images confirms the above findings. NON-VASCULAR Lower chest: There are patchy ground-glass opacities in the left lower lobe, likely infectious/inflammatory. There is scarring in both lung bases. Hepatobiliary: Patient is status post cholecystectomy. There is mild intra and extrahepatic biliary ductal dilatation. No focal liver lesions are identified. Pancreas: Unremarkable. No pancreatic ductal dilatation or surrounding inflammatory changes. Spleen: Normal in size without focal abnormality. Adrenals/Urinary Tract: Adrenal glands are unremarkable. Kidneys are normal, without renal calculi, focal lesion, or hydronephrosis. Bladder is unremarkable. Stomach/Bowel: Patient is likely status post gastric pull up/pull-through procedure. The visualized portion of the stomach is nondilated. There is ill-defined soft tissue density abutting the left side of the stomach at the level of the diaphragmatic hiatus surrounding the celiac axis. Dimensions are proximally 3.3 x 2.8 by 2.5 cm. There is no bowel obstruction, focal inflammation or free air. Appendix is not seen. There is sigmoid colon diverticulosis. No active gastrointestinal bleeding identified. Lymphatic: There are enlarged left  retroperitoneal lymph nodes the largest lymph node measuring 3.0 x 2.0 cm image 7/25. There is a nonenlarged portacaval lymph node. Reproductive: Uterus is not well defined on this study, but grossly within normal limits. Ovaries are not well defined. Other: Trace free fluid in the pelvis. Musculoskeletal: No acute or significant osseous findings. IMPRESSION: 1. No evidence for aortic dissection or aneurysm. 2. No active gastrointestinal bleeding identified. NON-VASCULAR 1. Patient is likely status post gastric pull up/ pull-through procedure. There is ill-defined soft tissue density abutting the left side of the stomach at the level of the diaphragmatic hiatus surrounding the celiac axis. This is worrisome for metastatic disease or local disease recurrence. 2. Left retroperitoneal lymphadenopathy worrisome for metastatic disease. 3. Trace free fluid in the pelvis. 4. Patchy ground-glass opacities in the left lower lobe, likely infectious/inflammatory. 5. Mild intra and extrahepatic biliary ductal dilatation likely related to prior cholecystectomy. Correlate clinically for biliary obstruction. Electronically Signed   By: ARonney AstersM.D.   On: 04/06/2022 19:56   PROCEDURES: Critical Care performed: No .1-3 Lead EKG Interpretation  Performed by: BNaaman Plummer MD Authorized by: BNaaman Plummer MD     Interpretation: normal     ECG rate:  67   ECG rate assessment: normal     Rhythm: sinus rhythm     Ectopy: none     Conduction: normal    MEDICATIONS ORDERED IN ED: Medications  iohexol (OMNIPAQUE) 350 MG/ML  injection 100 mL (100 mLs Intravenous Contrast Given 04/06/22 1924)   IMPRESSION / MDM / ASSESSMENT AND PLAN / ED COURSE  I reviewed the triage vital signs and the nursing notes.                             The patient is on the cardiac monitor to evaluate for evidence of arrhythmia and/or significant heart rate changes. Patient's presentation is most consistent with acute presentation  with potential threat to life or bodily function. This patient presents with diarrhea consistent with likely viral enteritis. Doubt acute bacterial diarrhea. Considered, but think unlikely, partial SBO, appendicitis, diverticulitis, other intraabdominal infection. Low suspicion for secondary causes of diarrhea such as hyperadrenergic state, pheo, adrenal crisis, hyperthyroidism, or sepsis. Doubt antibiotic associated diarrhea.  Plan: PO rehydration, reassess, discharge with OTC antidiarrheal meds//short course antibiotics  Dispo: Discharge home with PCP follow-up and strict return precautions   FINAL CLINICAL IMPRESSION(S) / ED DIAGNOSES   Final diagnoses:  Diarrhea, unspecified type  Chemotherapy induced diarrhea   Rx / DC Orders   ED Discharge Orders          Ordered    ondansetron (ZOFRAN-ODT) 8 MG disintegrating tablet  Every 8 hours PRN        04/06/22 2011           Note:  This document was prepared using Dragon voice recognition software and may include unintentional dictation errors.   Naaman Plummer, MD 04/06/22 2015

## 2022-04-06 NOTE — ED Triage Notes (Signed)
Pt via POV from home reporting a week of black stool with watery diarrhea. She took pepto bismol and imodium which helped relieve the diarrhea but she continues to have black stool. No prior hx stomach ulcers and denies heavy ETOH. Pt does not take blood thinners. Pt was treated a month ago for pneumonia and says she is having similar pain in her left ribs now, and she is having crampy/aching upper abd pain. Pt has an abdominal hernia and requests port access for her lab work. Pt is currently being treated for esophageal cancer with mets to lungs and is receiving chemo, most recently on 9/28.

## 2022-04-06 NOTE — ED Notes (Incomplete)
RN at bedside to introduce self to pt. This RN attempted to start PIV. Pt refusing and requesting port to be accessed only. This expressing concern in case medications or blood products need to be given since pt is actively receiving chemo. Pt still requesting port to be accessed versus PIV. This RN consulted IV team.

## 2023-01-08 ENCOUNTER — Inpatient Hospital Stay: Payer: Medicaid Other

## 2023-01-08 ENCOUNTER — Inpatient Hospital Stay
Admission: EM | Admit: 2023-01-08 | Discharge: 2023-01-11 | DRG: 871 | Disposition: A | Discharge status: Home / Self Care | Payer: Medicaid Other | Attending: Internal Medicine | Admitting: Internal Medicine

## 2023-01-08 ENCOUNTER — Emergency Department: Payer: Medicaid Other

## 2023-01-08 DIAGNOSIS — Z7951 Long term (current) use of inhaled steroids: Secondary | ICD-10-CM

## 2023-01-08 DIAGNOSIS — I10 Essential (primary) hypertension: Secondary | ICD-10-CM | POA: Diagnosis present

## 2023-01-08 DIAGNOSIS — D649 Anemia, unspecified: Secondary | ICD-10-CM | POA: Diagnosis present

## 2023-01-08 DIAGNOSIS — I959 Hypotension, unspecified: Secondary | ICD-10-CM | POA: Diagnosis not present

## 2023-01-08 DIAGNOSIS — Z8249 Family history of ischemic heart disease and other diseases of the circulatory system: Secondary | ICD-10-CM | POA: Diagnosis not present

## 2023-01-08 DIAGNOSIS — Z7983 Long term (current) use of bisphosphonates: Secondary | ICD-10-CM | POA: Diagnosis not present

## 2023-01-08 DIAGNOSIS — Z1152 Encounter for screening for COVID-19: Secondary | ICD-10-CM | POA: Diagnosis not present

## 2023-01-08 DIAGNOSIS — Z8501 Personal history of malignant neoplasm of esophagus: Secondary | ICD-10-CM | POA: Diagnosis not present

## 2023-01-08 DIAGNOSIS — J449 Chronic obstructive pulmonary disease, unspecified: Secondary | ICD-10-CM | POA: Diagnosis present

## 2023-01-08 DIAGNOSIS — Z79899 Other long term (current) drug therapy: Secondary | ICD-10-CM | POA: Diagnosis not present

## 2023-01-08 DIAGNOSIS — J441 Chronic obstructive pulmonary disease with (acute) exacerbation: Secondary | ICD-10-CM | POA: Diagnosis present

## 2023-01-08 DIAGNOSIS — J440 Chronic obstructive pulmonary disease with acute lower respiratory infection: Secondary | ICD-10-CM | POA: Diagnosis present

## 2023-01-08 DIAGNOSIS — R5381 Other malaise: Secondary | ICD-10-CM | POA: Diagnosis present

## 2023-01-08 DIAGNOSIS — J9621 Acute and chronic respiratory failure with hypoxia: Secondary | ICD-10-CM | POA: Diagnosis present

## 2023-01-08 DIAGNOSIS — Z9981 Dependence on supplemental oxygen: Secondary | ICD-10-CM

## 2023-01-08 DIAGNOSIS — K219 Gastro-esophageal reflux disease without esophagitis: Secondary | ICD-10-CM | POA: Diagnosis present

## 2023-01-08 DIAGNOSIS — D72829 Elevated white blood cell count, unspecified: Secondary | ICD-10-CM | POA: Diagnosis present

## 2023-01-08 DIAGNOSIS — Z841 Family history of disorders of kidney and ureter: Secondary | ICD-10-CM

## 2023-01-08 DIAGNOSIS — A419 Sepsis, unspecified organism: Principal | ICD-10-CM | POA: Diagnosis present

## 2023-01-08 DIAGNOSIS — Z95828 Presence of other vascular implants and grafts: Secondary | ICD-10-CM | POA: Diagnosis not present

## 2023-01-08 DIAGNOSIS — J189 Pneumonia, unspecified organism: Secondary | ICD-10-CM | POA: Diagnosis present

## 2023-01-08 DIAGNOSIS — T380X5A Adverse effect of glucocorticoids and synthetic analogues, initial encounter: Secondary | ICD-10-CM | POA: Diagnosis present

## 2023-01-08 DIAGNOSIS — M797 Fibromyalgia: Secondary | ICD-10-CM | POA: Diagnosis present

## 2023-01-08 DIAGNOSIS — C159 Malignant neoplasm of esophagus, unspecified: Secondary | ICD-10-CM | POA: Insufficient documentation

## 2023-01-08 DIAGNOSIS — F17210 Nicotine dependence, cigarettes, uncomplicated: Secondary | ICD-10-CM | POA: Diagnosis present

## 2023-01-08 DIAGNOSIS — M810 Age-related osteoporosis without current pathological fracture: Secondary | ICD-10-CM | POA: Diagnosis present

## 2023-01-08 LAB — COMPREHENSIVE METABOLIC PANEL WITH GFR
ALT: 16 U/L (ref 0–44)
AST: 28 U/L (ref 15–41)
Albumin: 3 g/dL — ABNORMAL LOW (ref 3.5–5.0)
Alkaline Phosphatase: 78 U/L (ref 38–126)
Anion gap: 13 (ref 5–15)
BUN: 11 mg/dL (ref 6–20)
CO2: 23 mmol/L (ref 22–32)
Calcium: 8.9 mg/dL (ref 8.9–10.3)
Chloride: 97 mmol/L — ABNORMAL LOW (ref 98–111)
Creatinine, Ser: 0.76 mg/dL (ref 0.44–1.00)
GFR, Estimated: >60 mL/min
Glucose, Bld: 152 mg/dL — ABNORMAL HIGH (ref 70–99)
Potassium: 3.4 mmol/L — ABNORMAL LOW (ref 3.5–5.1)
Sodium: 133 mmol/L — ABNORMAL LOW (ref 135–145)
Total Bilirubin: 1.7 mg/dL — ABNORMAL HIGH (ref 0.3–1.2)
Total Protein: 7.6 g/dL (ref 6.5–8.1)

## 2023-01-08 LAB — LACTIC ACID, PLASMA
Lactic Acid, Venous: 1.1 mmol/L (ref 0.5–1.9)
Lactic Acid, Venous: 1 mmol/L (ref 0.5–1.9)

## 2023-01-08 LAB — CBC WITH DIFFERENTIAL/PLATELET
Abs Immature Granulocytes: 0.06 10*3/uL (ref 0.00–0.07)
Basophils Absolute: 0.1 10*3/uL (ref 0.0–0.1)
Basophils Relative: 1 %
Eosinophils Absolute: 0.1 10*3/uL (ref 0.0–0.5)
Eosinophils Relative: 1 %
HCT: 32.9 % — ABNORMAL LOW (ref 36.0–46.0)
Hemoglobin: 10.5 g/dL — ABNORMAL LOW (ref 12.0–15.0)
Immature Granulocytes: 0 %
Lymphocytes Relative: 13 %
Lymphs Abs: 2 10*3/uL (ref 0.7–4.0)
MCH: 25.2 pg — ABNORMAL LOW (ref 26.0–34.0)
MCHC: 31.9 g/dL (ref 30.0–36.0)
MCV: 79.1 fL — ABNORMAL LOW (ref 80.0–100.0)
Monocytes Absolute: 1 10*3/uL (ref 0.1–1.0)
Monocytes Relative: 6 %
Neutro Abs: 12.6 10*3/uL — ABNORMAL HIGH (ref 1.7–7.7)
Neutrophils Relative %: 79 %
Platelets: 370 10*3/uL (ref 150–400)
RBC: 4.16 MIL/uL (ref 3.87–5.11)
RDW: 14.8 % (ref 11.5–15.5)
WBC: 15.9 10*3/uL — ABNORMAL HIGH (ref 4.0–10.5)
nRBC: 0 % (ref 0.0–0.2)

## 2023-01-08 LAB — BLOOD CULTURE ID PANEL (REFLEXED) - BCID2

## 2023-01-08 LAB — RESPIRATORY PANEL BY PCR

## 2023-01-08 LAB — CULTURE, BLOOD (ROUTINE X 2)

## 2023-01-08 LAB — PROCALCITONIN: Procalcitonin: 1.84 ng/mL

## 2023-01-08 LAB — MAGNESIUM: Magnesium: 1.8 mg/dL (ref 1.7–2.4)

## 2023-01-08 LAB — TROPONIN I (HIGH SENSITIVITY)
Troponin I (High Sensitivity): 3 ng/L
Troponin I (High Sensitivity): 4 ng/L (ref <18)

## 2023-01-08 LAB — STREP PNEUMONIAE URINARY ANTIGEN: Strep Pneumo Urinary Antigen: NEGATIVE

## 2023-01-08 LAB — EXPECTORATED SPUTUM ASSESSMENT W GRAM STAIN, RFLX TO RESP C

## 2023-01-08 LAB — SARS CORONAVIRUS 2 BY RT PCR: SARS Coronavirus 2 by RT PCR: NEGATIVE

## 2023-01-08 LAB — BRAIN NATRIURETIC PEPTIDE: B Natriuretic Peptide: 59.8 pg/mL (ref 0.0–100.0)

## 2023-01-08 LAB — CULTURE, RESPIRATORY W GRAM STAIN

## 2023-01-08 MED ORDER — ENOXAPARIN SODIUM 40 MG/0.4ML IJ SOSY
40.0000 mg | PREFILLED_SYRINGE | INTRAMUSCULAR | Status: DC
  Administered 2023-01-08 – 2023-01-10 (×3): 40 mg via SUBCUTANEOUS
  Filled 2023-01-08 (×3): qty 0.4

## 2023-01-08 MED ORDER — SODIUM CHLORIDE 0.9 % IV SOLN
500.0000 mg | INTRAVENOUS | Status: DC
  Filled 2023-01-08: qty 5

## 2023-01-08 MED ORDER — KETOROLAC TROMETHAMINE 30 MG/ML IJ SOLN
15.0000 mg | Freq: Once | INTRAMUSCULAR | Status: AC
  Administered 2023-01-08: 15 mg via INTRAVENOUS
  Filled 2023-01-08: qty 1

## 2023-01-08 MED ORDER — METHYLPREDNISOLONE SODIUM SUCC 125 MG IJ SOLR
125.0000 mg | Freq: Once | INTRAMUSCULAR | Status: AC
  Administered 2023-01-08: 125 mg via INTRAVENOUS
  Filled 2023-01-08: qty 2

## 2023-01-08 MED ORDER — IPRATROPIUM-ALBUTEROL 0.5-2.5 (3) MG/3ML IN SOLN
6.0000 mL | Freq: Once | RESPIRATORY_TRACT | Status: AC
  Administered 2023-01-08: 6 mL via RESPIRATORY_TRACT
  Filled 2023-01-08: qty 6

## 2023-01-08 MED ORDER — LACTATED RINGERS IV SOLN: INTRAVENOUS | Status: AC

## 2023-01-08 MED ORDER — OXYCODONE HCL 5 MG PO TABS
15.0000 mg | ORAL_TABLET | ORAL | Status: DC | PRN
  Administered 2023-01-08 – 2023-01-11 (×13): 15 mg via ORAL
  Filled 2023-01-08 (×13): qty 3

## 2023-01-08 MED ORDER — OXYCODONE HCL 5 MG PO TABS
15.0000 mg | ORAL_TABLET | Freq: Four times a day (QID) | ORAL | Status: DC | PRN
  Administered 2023-01-08: 15 mg via ORAL
  Filled 2023-01-08: qty 3

## 2023-01-08 MED ORDER — IOHEXOL 350 MG/ML SOLN
75.0000 mL | Freq: Once | INTRAVENOUS | Status: AC | PRN
  Administered 2023-01-08: 75 mL via INTRAVENOUS

## 2023-01-08 MED ORDER — METHYLPREDNISOLONE SODIUM SUCC 40 MG IJ SOLR
40.0000 mg | Freq: Every day | INTRAMUSCULAR | Status: AC
  Administered 2023-01-08: 40 mg via INTRAVENOUS
  Filled 2023-01-08: qty 1

## 2023-01-08 MED ORDER — ONDANSETRON HCL 4 MG PO TABS: 4.0000 mg | ORAL_TABLET | Freq: Four times a day (QID) | ORAL | Status: DC | PRN

## 2023-01-08 MED ORDER — PREGABALIN 75 MG PO CAPS
200.0000 mg | ORAL_CAPSULE | Freq: Two times a day (BID) | ORAL | Status: DC
  Administered 2023-01-08 – 2023-01-09 (×3): 200 mg via ORAL
  Filled 2023-01-08: qty 1
  Filled 2023-01-08 (×2): qty 4

## 2023-01-08 MED ORDER — PREDNISONE 20 MG PO TABS
40.0000 mg | ORAL_TABLET | Freq: Every day | ORAL | Status: DC
  Administered 2023-01-09 – 2023-01-10 (×2): 40 mg via ORAL
  Filled 2023-01-08 (×2): qty 2

## 2023-01-08 MED ORDER — LIDOCAINE 5 % EX PTCH
1.0000 | MEDICATED_PATCH | CUTANEOUS | Status: DC
  Administered 2023-01-08 – 2023-01-11 (×4): 1 via TRANSDERMAL
  Filled 2023-01-08 (×5): qty 1

## 2023-01-08 MED ORDER — LACTATED RINGERS IV BOLUS
500.0000 mL | Freq: Once | INTRAVENOUS | Status: AC
  Administered 2023-01-08: 500 mL via INTRAVENOUS

## 2023-01-08 MED ORDER — SODIUM CHLORIDE 0.9 % IV SOLN
500.0000 mg | Freq: Once | INTRAVENOUS | Status: AC
  Administered 2023-01-08: 500 mg via INTRAVENOUS
  Filled 2023-01-08: qty 5

## 2023-01-08 MED ORDER — SODIUM CHLORIDE 0.9 % IV SOLN
2.0000 g | INTRAVENOUS | Status: DC
  Administered 2023-01-09 – 2023-01-11 (×3): 2 g via INTRAVENOUS
  Filled 2023-01-08 (×3): qty 20

## 2023-01-08 MED ORDER — SODIUM CHLORIDE 0.9 % IV SOLN
1.0000 g | Freq: Once | INTRAVENOUS | Status: AC
  Administered 2023-01-08: 1 g via INTRAVENOUS
  Filled 2023-01-08: qty 10

## 2023-01-08 MED ORDER — OXYBUTYNIN CHLORIDE ER 5 MG PO TB24: 15.0000 mg | ORAL_TABLET | Freq: Every day | ORAL | Status: DC

## 2023-01-08 MED ORDER — ONDANSETRON HCL 4 MG/2ML IJ SOLN: 4.0000 mg | Freq: Four times a day (QID) | INTRAMUSCULAR | Status: DC | PRN

## 2023-01-08 NOTE — Assessment & Plan Note (Addendum)
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 She had evidence of recurrent/ metastatic disease in 06/2021  Followed by [facility] hematology oncology [facility] in place Will otherwise continue to monitor closely

## 2023-01-08 NOTE — Assessment & Plan Note (Signed)
Positive COPD exacerbation in setting of concurrent pneumonia IV Solu-Medrol IV Rocephin azithromycin for pneumonia coverage DuoNebs Continue supplemental oxygen as needed Follow

## 2023-01-08 NOTE — Progress Notes (Signed)
Elink monitoring for the code sepsis protocol.  

## 2023-01-08 NOTE — Assessment & Plan Note (Signed)
Patient meeting sepsis criteria by heart rate 100s, white count of 15 Noted pulmonary source with concern for pneumonia Lactate reassuring at 1.1 Lower limit of normal BP with systolic pressures in the 80s to 100s LR bolus and maintenance IV fluids Panculture Otherwise follow closely

## 2023-01-08 NOTE — Progress Notes (Signed)
CODE SEPSIS - PHARMACY COMMUNICATION  **Broad Spectrum Antibiotics should be administered within 1 hour of Sepsis diagnosis**  Time Code Sepsis Called/Page Received: 7/7 @ 0512  Antibiotics Ordered: Ceftriaxone, Azithromycin  Time of 1st antibiotic administration: Ceftriaxone 1 gm IV X 1 given on 7/7 @ 0443   Additional action taken by pharmacy:   If necessary, Name of Provider/Nurse Contacted:     Britian Jentz D ,PharmD Clinical Pharmacist  01/08/2023  5:13 AM

## 2023-01-08 NOTE — Assessment & Plan Note (Signed)
PPI ?

## 2023-01-08 NOTE — ED Notes (Signed)
Patient is refusing blood work at this time 

## 2023-01-08 NOTE — ED Provider Notes (Signed)
[facility] Provider Note    Event Date/Time   First MD Initiated Contact with Patient 01/08/23 [phone]     (approximate)   History   Shortness of Breath   HPI  Haley Hebert is a 57 y.o. female who presents to the ED for evaluation of Shortness of Breath   Patient history of esophageal cancer and COPD presents for evaluation of cough, fevers, increased sputum production and shortness of breath.   Physical Exam   Triage Vital Signs: ED Triage Vitals  Enc Vitals Group     BP 01/08/23 0300 107/71     Pulse Rate 01/08/23 0300 96     Resp 01/08/23 0300 (!) 21     Temp 01/08/23 0340 97.8 F (36.6 C)     Temp Source 01/08/23 0340 Oral     SpO2 01/08/23 0300 (!) 89 %     Weight 01/08/23 0308 110 lb 0.2 oz (49.9 kg)     Height 01/08/23 0308 5\' 2"  (1.575 m)     Head Circumference --      Peak Flow --      Pain Score 01/08/23 0307 9     Pain Loc --      Pain Edu? --      Excl. in [place]? --     Most recent vital signs: Vitals:   01/08/23 0340 01/08/23 0400  BP:  104/75  Pulse:  99  Resp:  (!) 33  Temp: 97.8 F (36.6 C)   SpO2:  93%    General: Awake, no distress.  Frail and thin CV:  Good peripheral perfusion.  Tachycardic and regular Resp:  Tachypneic, wheezing throughout with decreased airflow Abd:  No distention.  Soft MSK:  No deformity noted.  No swelling Neuro:  No focal deficits appreciated. Other:     ED Results / Procedures / Treatments   Labs (all labs ordered are listed, but only abnormal results are displayed) Labs Reviewed  COMPREHENSIVE METABOLIC PANEL - Abnormal; Notable for the following components:      Result Value   Sodium 133 (*)    Potassium 3.4 (*)    Chloride 97 (*)    Glucose, Bld 152 (*)    Albumin 3.0 (*)    Total Bilirubin 1.7 (*)    All other components within normal limits  CBC WITH DIFFERENTIAL/PLATELET - Abnormal; Notable for the following components:   WBC 15.9 (*)    Hemoglobin 10.5 (*)    HCT  32.9 (*)    MCV 79.1 (*)    MCH 25.2 (*)    Neutro Abs 12.6 (*)    All other components within normal limits  SARS CORONAVIRUS 2 BY RT PCR  CULTURE, BLOOD (ROUTINE X 2)  CULTURE, BLOOD (ROUTINE X 2)  BRAIN NATRIURETIC PEPTIDE  MAGNESIUM  LACTIC ACID, PLASMA  LACTIC ACID, PLASMA  PROCALCITONIN  TROPONIN I (HIGH SENSITIVITY)  TROPONIN I (HIGH SENSITIVITY)    EKG Sinus rhythm with a rate of 98 bpm.  Normal axis and intervals.  Nonspecific lateral ST changes.  No STEMI.  RADIOLOGY 1 view CXR interpreted by me with multifocal infiltrates  Official radiology report(s): DG Chest Portable 1 View  Result Date: 01/08/2023 CLINICAL DATA:  Shortness of breath. EXAM: PORTABLE CHEST 1 VIEW COMPARISON:  January 07, 2020 FINDINGS: A right-sided venous Port-A-Cath is seen with its distal tip noted at the junction of the superior vena cava and right atrium. The heart size and mediastinal contours are within  normal limits. The lungs are hyperinflated. Surgical sutures are seen along the suprahilar region on the right. Mild to moderate severity airspace disease is seen within the bilateral lower lobes. No pleural effusion or pneumothorax is identified. Radiopaque surgical clips are seen overlying the right upper quadrant. The visualized skeletal structures are unremarkable. IMPRESSION: Mild to moderate severity bilateral lower lobe airspace disease. This may be infectious in etiology, however, follow-up to resolution is recommended, as sequelae associated with an underlying neoplastic process cannot be excluded. Electronically Signed   By: Aram Candela M.D.   On: 01/08/2023 03:39    PROCEDURES and INTERVENTIONS:  .1-3 Lead EKG Interpretation  Performed by: Delton Prairie, MD Authorized by: Delton Prairie, MD     Interpretation: abnormal     ECG rate:  104   ECG rate assessment: tachycardic     Rhythm: sinus tachycardia     Ectopy: none     Conduction: normal   .Critical Care  Performed by: Delton Prairie, MD Authorized by: Delton Prairie, MD   Critical care provider statement:    Critical care time (minutes):  30   Critical care time was exclusive of:  Separately billable procedures and treating other patients   Critical care was necessary to treat or prevent imminent or life-threatening deterioration of the following conditions:  Sepsis   Critical care was time spent personally by me on the following activities:  Development of treatment plan with patient or surrogate, discussions with consultants, evaluation of patient's response to treatment, examination of patient, ordering and review of laboratory studies, ordering and review of radiographic studies, ordering and performing treatments and interventions, pulse oximetry, re-evaluation of patient's condition and review of old charts   Medications  cefTRIAXone (ROCEPHIN) 1 g in sodium chloride 0.9 % 100 mL IVPB (1 g Intravenous New Bag/Given 01/08/23 0443)  azithromycin (ZITHROMAX) 500 mg in sodium chloride 0.9 % 250 mL IVPB (has no administration in time range)  lactated ringers infusion (has no administration in time range)  ipratropium-albuterol (DUONEB) 0.5-2.5 (3) MG/3ML nebulizer solution 6 mL (6 mLs Nebulization Given 01/08/23 0344)  methylPREDNISolone sodium succinate (SOLU-MEDROL) 125 mg/2 mL injection 125 mg (125 mg Intravenous Given 01/08/23 0344)  ketorolac (TORADOL) 30 MG/ML injection 15 mg (15 mg Intravenous Given 01/08/23 0505)     IMPRESSION / MDM / ASSESSMENT AND PLAN / ED COURSE  I reviewed the triage vital signs and the nursing notes.  Differential diagnosis includes, but is not limited to, pneumothorax, COPD exacerbation, pneumonia, ACS  {Patient presents with symptoms of an acute illness or injury that is potentially life-threatening.  Patient presents with evidence of sepsis from community-acquired pneumonia requiring medical admission.  Tachycardic and tachypneic, but stable without hypoxia on her chronic 2 L.   Leukocytosis is noted.  Normal troponins and BNP and lactic acid.  Will draw cultures, provide ceftriaxone and azithromycin.  Consult medicine for admission.      FINAL CLINICAL IMPRESSION(S) / ED DIAGNOSES   Final diagnoses:  Community acquired pneumonia, unspecified laterality  Sepsis without acute organ dysfunction, due to unspecified organism [facility])     Rx / DC Orders   ED Discharge Orders     None        Note:  This document was prepared using Dragon voice recognition software and may include unintentional dictation errors.   Delton Prairie, MD 01/08/23 [phone]

## 2023-01-08 NOTE — Progress Notes (Signed)
PHARMACY - PHYSICIAN COMMUNICATION CRITICAL VALUE ALERT - BLOOD CULTURE IDENTIFICATION (BCID)  Haley Hebert is an 57 y.o. female who presented to [facility] on 01/08/2023 with a chief complaint of Sepsis from PNA  Assessment:  Staph epi in 2 of 4 bottles, no resistance (include suspected source if known)  Name of physician (or Provider) Contacted:  Lindajo Royal, MD   Current antibiotics: Ceftriaxone, Azithromycin   Changes to prescribed antibiotics recommended:  Recommendations declined by provider due to multiple possible sources of infection - suggested changing to cefazolin 2 gm IV Q8H, MD wants to continue pt on current abx   Results for orders placed or performed during the hospital encounter of 01/08/23  Blood Culture ID Panel (Reflexed) (Collected: 01/08/2023  4:17 AM)  Result Value Ref Range   Enterococcus faecalis NOT DETECTED NOT DETECTED   Enterococcus Faecium NOT DETECTED NOT DETECTED   Listeria monocytogenes NOT DETECTED NOT DETECTED   Staphylococcus species DETECTED (A) NOT DETECTED   Staphylococcus aureus (BCID) NOT DETECTED NOT DETECTED   Staphylococcus epidermidis DETECTED (A) NOT DETECTED   Staphylococcus lugdunensis NOT DETECTED NOT DETECTED   Streptococcus species NOT DETECTED NOT DETECTED   Streptococcus agalactiae NOT DETECTED NOT DETECTED   Streptococcus pneumoniae NOT DETECTED NOT DETECTED   Streptococcus pyogenes NOT DETECTED NOT DETECTED   A.calcoaceticus-baumannii NOT DETECTED NOT DETECTED   Bacteroides fragilis NOT DETECTED NOT DETECTED   Enterobacterales NOT DETECTED NOT DETECTED   Enterobacter cloacae complex NOT DETECTED NOT DETECTED   Escherichia coli NOT DETECTED NOT DETECTED   Klebsiella aerogenes NOT DETECTED NOT DETECTED   Klebsiella oxytoca NOT DETECTED NOT DETECTED   Klebsiella pneumoniae NOT DETECTED NOT DETECTED   Proteus species NOT DETECTED NOT DETECTED   Salmonella species NOT DETECTED NOT DETECTED   Serratia marcescens NOT DETECTED  NOT DETECTED   Haemophilus influenzae NOT DETECTED NOT DETECTED   Neisseria meningitidis NOT DETECTED NOT DETECTED   Pseudomonas aeruginosa NOT DETECTED NOT DETECTED   Stenotrophomonas maltophilia NOT DETECTED NOT DETECTED   Candida albicans NOT DETECTED NOT DETECTED   Candida auris NOT DETECTED NOT DETECTED   Candida glabrata NOT DETECTED NOT DETECTED   Candida krusei NOT DETECTED NOT DETECTED   Candida parapsilosis NOT DETECTED NOT DETECTED   Candida tropicalis NOT DETECTED NOT DETECTED   Cryptococcus neoformans/gattii NOT DETECTED NOT DETECTED   Methicillin resistance mecA/C NOT DETECTED NOT DETECTED    [name] D 01/08/2023  10:39 PM

## 2023-01-08 NOTE — ED Triage Notes (Signed)
Patient arrived POV from home. Patient reports being asleep. Patient reports going to "pee" and being SOB unable to recover. Husband drove her to ED. Patient has a history of COPD and esophogeal cancer.

## 2023-01-08 NOTE — ED Notes (Signed)
Patient allowed RN to do 1 set of blood cultures at this time. Provider aware

## 2023-01-08 NOTE — Assessment & Plan Note (Signed)
Decompensated respiratory failure requiring 3 to 4 L with baseline 2 L home O2 use in the setting of pneumonia and COPD exacerbation Chest x-ray moderate bilateral lower lobe airspace disease Will add on CTA of the chest to rule out PE in the setting of active malignancy with esophageal cancer Continue supplemental oxygen Will place on pneumonia coverage with IV Rocephin and azithromycin Panculture Urine strep and Legionella Sputum culture and respiratory respiratory panel IV Solu-Medrol DuoNebs Continue to follow respiratory status closely

## 2023-01-08 NOTE — H&P (Addendum)
History and Physical    Patient: Haley Hebert [id number]:[id number] DOB: Oct 24, 1965 DOA: 01/08/2023 DOS: the patient was seen and examined on 01/08/2023 PCP: Su Monks, PA  Patient coming from: Home  Chief Complaint:  Chief Complaint  Patient presents with   Shortness of Breath   HPI: Haley Hebert is a 57 y.o. female with medical history significant of COPD, chronic respiratory failure on 2 L, esophageal cancer followed by [facility] oncology, GERD presenting with acute on chronic respiratory failure with hypoxia, sepsis, pneumonia, COPD exacerbation.  Patient reports increased worsening shortness of breath over the past 1 week.  Positive cough, wheezing, increased sputum production.  Positive generalized malaise.  Shortness of breath is progressively worsened over this timeframe.  No reported sick contacts.  Longstanding prior smoking history.  No longer smoking.  No chest pain.  No abdominal pain nausea vomiting.  No hemiparesis or confusion.  Has been using home inhalers with minimal improvement in symptoms Presented to the ER afebrile, otherwise hemodynamically stable with intermittent heart rate into the 100s, blood pressures 80s to 100s over 60s to 90s.  Satting in the upper 80s on 2 L, transition to 4+ liters nasal cannula.  White count 16, hemoglobin 10.5, platelets of 370, creatinine 0.76, glucose 152.  Chest x-ray with mild to moderate severe bilateral lower lobe airspace disease. Review of Systems: As mentioned in the history of present illness. All other systems reviewed and are negative. Past Medical History:  Diagnosis Date   Acid reflux    Asthma    Chronic gastritis 08/06/2018   Collapsed vertebra (HCC) 10/11/2018   lumbosacral   COPD (chronic obstructive pulmonary disease) (HCC)    Esophagitis, reflux 08/06/2018   Fibromyalgia    GERD (gastroesophageal reflux disease) 10/11/2018   Hypertension 10/11/2018   Lumbago with sciatica, left side 10/11/2018   Osteoporosis 10/11/2018   Past  Surgical History:  Procedure Laterality Date   APPENDECTOMY     CHOLECYSTECTOMY     Social History:  reports that she has been smoking. She has a 20.00 pack-year smoking history. She has never used smokeless tobacco. She reports that she does not currently use alcohol. She reports that she does not currently use drugs.  No Known Allergies  Family History  Problem Relation Age of Onset   Kidney disease Mother    Heart disease Mother    Heart attack Father     Prior to Admission medications   Medication Sig Start Date End Date Taking? Authorizing Provider  alendronate (FOSAMAX) 70 MG tablet Take by mouth once a week. 12/31/19   [provider]  amoxicillin-clavulanate (AUGMENTIN) 875-125 MG tablet Take 1 tablet by mouth 2 (two) times daily. Patient not taking: Reported on 04/06/2022 03/10/22   [provider]  Ernestina Patches 62.5-25 MCG/ACT AEPB Inhale 1 puff into the lungs daily. 03/15/22   [provider]  azelastine (ASTELIN) 0.1 % nasal spray Place 1 spray into both nostrils 2 (two) times daily. 12/09/19   [provider]  busPIRone (BUSPAR) 7.5 MG tablet TAKE 1 TABLET (7.5 MG TOTAL) BY MOUTH 2 (TWO) TIMES DAILY FOR 90 DAYS 12/24/18   [provider]  cetirizine (ZYRTEC) 10 MG tablet Take 10 mg by mouth daily. 12/24/18   [provider]  COMBIVENT RESPIMAT 20-100 MCG/ACT AERS respimat 2-4 puffs daily as needed. 12/10/21   [provider]  escitalopram (LEXAPRO) 20 MG tablet Take 20 mg by mouth daily. 12/21/19   [provider]  fluticasone (FLONASE) 50  MCG/ACT nasal spray Place 1 spray into both nostrils 2 (two) times daily. 12/21/19   [provider]  hydrochlorothiazide (HYDRODIURIL) 25 MG tablet Take 25 mg by mouth every morning. Taking 1/2 tablet 01/02/20   [provider]  ibuprofen (ADVIL) 600 MG tablet Take 1 tablet (600 mg total) by mouth every 6 (six) hours as needed. 01/07/20   Merrilee Jansky, MD   ibuprofen (ADVIL) 800 MG tablet Take 1 tablet by mouth every 8 (eight) hours as needed. Do not take with Meloxicam 03/20/19   [provider]  lidocaine (XYLOCAINE) 2 % solution Use as directed 15 mLs in the mouth or throat as needed for mouth pain. Patient not taking: Reported on 04/06/2022 10/28/20   Chesley Noon, MD  meloxicam (MOBIC) 15 MG tablet Take 15 mg by mouth daily as needed. 01/02/20   [provider]  montelukast (SINGULAIR) 10 MG tablet TAKE 1 TABLET BY MOUTH EVERY DAY AT NIGHT 12/24/18   [provider]  omeprazole (PRILOSEC) 40 MG capsule Take 40 mg by mouth 2 (two) times daily. 12/24/18   [provider]  ondansetron (ZOFRAN-ODT) 8 MG disintegrating tablet Take 1 tablet (8 mg total) by mouth every 8 (eight) hours as needed for nausea or vomiting. 04/06/22   Merwyn Katos, MD  oxybutynin (DITROPAN XL) 15 MG 24 hr tablet TAKE 1 TABLET BY MOUTH EVERY DAY Patient not taking: Reported on 04/06/2022 02/19/20   Michiel Cowboy A, PA-C  oxyCODONE (ROXICODONE) 5 MG/5ML solution Take 15 mLs by mouth 4 (four) times daily as needed. 03/15/22   [provider]  pantoprazole (PROTONIX) 40 MG tablet Take 40 mg by mouth 2 (two) times daily. Patient not taking: Reported on 04/06/2022 02/09/22   [provider]  potassium chloride (MICRO-K) 10 MEQ CR capsule Take 10 mEq by mouth every morning. Patient not taking: Reported on 04/06/2022 01/02/20   [provider]  pregabalin (LYRICA) 200 MG capsule Take 200 mg by mouth 2 (two) times daily. 01/16/19   [provider]  raloxifene (EVISTA) 60 MG tablet Take 60 mg by mouth daily. 01/13/22   [provider]  tiZANidine (ZANAFLEX) 4 MG tablet TAKE 1 TABLET (4 MG TOTAL) BY MOUTH 3 (THREE) TIMES DAILY AS NEEDED FOR MUSCLE SPASMS 01/12/19   [provider]  traZODone (DESYREL) 150 MG tablet TAKE 2 TABLETS (300 MG TOTAL) BY MOUTH NIGHTLY 12/29/18   [provider]  TRELEGY  ELLIPTA 100-62.5-25 MCG/INH AEPB INHALE 1 INHALATION INTO THE LUNGS EVERY MORNING Patient not taking: Reported on 04/06/2022 12/12/18   [provider]    Physical Exam: Vitals:   01/08/23 0530 01/08/23 0730 01/08/23 0800 01/08/23 0830  BP: 98/67 (!) 88/66 93/64 (!) 83/61  Pulse: 89 72 73 74  Resp:   18 19  Temp:      TempSrc:      SpO2: 93% 98% 94% 96%  Weight:      Height:       Physical Exam Constitutional:      Comments: Underweight/cachectic  HENT:     Head: Normocephalic.  Eyes:     Pupils: Pupils are equal, round, and reactive to light.  Cardiovascular:     Rate and Rhythm: Normal rate and regular rhythm.  Pulmonary:     Effort: Pulmonary effort is normal.     Breath sounds: Wheezing and rales present.  Abdominal:     General: Bowel sounds are normal.  Musculoskeletal:  General: Normal range of motion.  Skin:    General: Skin is dry.  Neurological:     General: No focal deficit present.  Psychiatric:        Mood and Affect: Mood normal.     Data Reviewed:  There are no new results to review at this time. DG Chest Portable 1 View CLINICAL DATA:  Shortness of breath.  EXAM: PORTABLE CHEST 1 VIEW  COMPARISON:  January 07, 2020  FINDINGS: A right-sided venous Port-A-Cath is seen with its distal tip noted at the junction of the superior vena cava and right atrium. The heart size and mediastinal contours are within normal limits. The lungs are hyperinflated. Surgical sutures are seen along the suprahilar region on the right. Mild to moderate severity airspace disease is seen within the bilateral lower lobes. No pleural effusion or pneumothorax is identified. Radiopaque surgical clips are seen overlying the right upper quadrant. The visualized skeletal structures are unremarkable.  IMPRESSION: Mild to moderate severity bilateral lower lobe airspace disease. This may be infectious in etiology, however, follow-up to resolution is recommended,  as sequelae associated with an underlying neoplastic process cannot be excluded.  Electronically Signed   By: Aram Candela M.D.   On: 01/08/2023 03:39  Lab Results  Component Value Date   WBC 15.9 (H) 01/08/2023   HGB 10.5 (L) 01/08/2023   HCT 32.9 (L) 01/08/2023   MCV 79.1 (L) 01/08/2023   PLT 370 01/08/2023   Last metabolic panel Lab Results  Component Value Date   GLUCOSE 152 (H) 01/08/2023   NA 133 (L) 01/08/2023   K 3.4 (L) 01/08/2023   CL 97 (L) 01/08/2023   CO2 23 01/08/2023   BUN 11 01/08/2023   CREATININE 0.76 01/08/2023   GFRNONAA >60 01/08/2023   CALCIUM 8.9 01/08/2023   PROT 7.6 01/08/2023   ALBUMIN 3.0 (L) 01/08/2023   BILITOT 1.7 (H) 01/08/2023   ALKPHOS 78 01/08/2023   AST 28 01/08/2023   ALT 16 01/08/2023   ANIONGAP 13 01/08/2023    Assessment and Plan: * Sepsis due to pneumonia [facility]) Patient meeting sepsis criteria by heart rate 100s, white count of 15 Noted pulmonary source with concern for pneumonia Lactate reassuring at 1.1 Lower limit of normal BP with systolic pressures in the 80s to 100s LR bolus and maintenance IV fluids Panculture Otherwise follow closely   Acute on chronic respiratory failure with hypoxia (HCC) Decompensated respiratory failure requiring 3 to 4 L with baseline 2 L home O2 use in the setting of pneumonia and COPD exacerbation Chest x-ray moderate bilateral lower lobe airspace disease Will add on CTA of the chest to rule out PE in the setting of active malignancy with esophageal cancer Continue supplemental oxygen Will place on pneumonia coverage with IV Rocephin and azithromycin Panculture Urine strep and Legionella Sputum culture and respiratory respiratory panel IV Solu-Medrol DuoNebs Continue to follow respiratory status closely   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 She had evidence of recurrent/ metastatic disease in 06/2021  Followed by [facility]  hematology oncology [facility] in place Will otherwise continue to monitor closely  GERD (gastroesophageal reflux disease) PPI  COPD (chronic obstructive pulmonary disease) (HCC) Positive COPD exacerbation in setting of concurrent pneumonia IV Solu-Medrol IV Rocephin azithromycin for pneumonia coverage DuoNebs Continue supplemental oxygen as needed Follow      Advance Care Planning:   Code Status: Full Code   Consults: None   Family Communication: Daughter at the  bedside   Severity of Illness: The appropriate patient status for this patient is INPATIENT. Inpatient status is judged to be reasonable and necessary in order to provide the required intensity of service to ensure the patient's safety. The patient's presenting symptoms, physical exam findings, and initial radiographic and laboratory data in the context of their chronic comorbidities is felt to place them at high risk for further clinical deterioration. Furthermore, it is not anticipated that the patient will be medically stable for discharge from the hospital within 2 midnights of admission.   * I certify that at the point of admission it is my clinical judgment that the patient will require inpatient hospital care spanning beyond 2 midnights from the point of admission due to high intensity of service, high risk for further deterioration and high frequency of surveillance required.*  Author: Floydene Flock, MD 01/08/2023 8:58 AM  For on call review [email or web address].

## 2023-01-09 ENCOUNTER — Encounter: Payer: Self-pay | Admitting: Internal Medicine

## 2023-01-09 ENCOUNTER — Other Ambulatory Visit: Payer: Self-pay

## 2023-01-09 DIAGNOSIS — J189 Pneumonia, unspecified organism: Secondary | ICD-10-CM | POA: Diagnosis not present

## 2023-01-09 DIAGNOSIS — J441 Chronic obstructive pulmonary disease with (acute) exacerbation: Secondary | ICD-10-CM | POA: Diagnosis not present

## 2023-01-09 DIAGNOSIS — M797 Fibromyalgia: Secondary | ICD-10-CM

## 2023-01-09 DIAGNOSIS — J9621 Acute and chronic respiratory failure with hypoxia: Secondary | ICD-10-CM | POA: Diagnosis not present

## 2023-01-09 LAB — COMPREHENSIVE METABOLIC PANEL
ALT: 15 U/L (ref 0–44)
AST: 18 U/L (ref 15–41)
Albumin: 2.4 g/dL — ABNORMAL LOW (ref 3.5–5.0)
Alkaline Phosphatase: 60 U/L (ref 38–126)
Anion gap: 7 (ref 5–15)
BUN: 16 mg/dL (ref 6–20)
CO2: 26 mmol/L (ref 22–32)
Calcium: 8.5 mg/dL — ABNORMAL LOW (ref 8.9–10.3)
Chloride: 102 mmol/L (ref 98–111)
Creatinine, Ser: 0.67 mg/dL (ref 0.44–1.00)
GFR, Estimated: >60 mL/min (ref >60)
Glucose, Bld: 149 mg/dL — ABNORMAL HIGH (ref 70–99)
Potassium: 3.9 mmol/L (ref 3.5–5.1)
Sodium: 135 mmol/L (ref 135–145)
Total Bilirubin: 0.2 mg/dL — ABNORMAL LOW (ref 0.3–1.2)
Total Protein: 6.5 g/dL (ref 6.5–8.1)

## 2023-01-09 LAB — CBC
HCT: 27.2 % — ABNORMAL LOW (ref 36.0–46.0)
Hemoglobin: 9 g/dL — ABNORMAL LOW (ref 12.0–15.0)
MCH: 26.2 pg (ref 26.0–34.0)
MCHC: 33.1 g/dL (ref 30.0–36.0)
MCV: 79.3 fL — ABNORMAL LOW (ref 80.0–100.0)
Platelets: 319 10*3/uL (ref 150–400)
RBC: 3.43 MIL/uL — ABNORMAL LOW (ref 3.87–5.11)
RDW: 14.7 % (ref 11.5–15.5)
WBC: 13.1 10*3/uL — ABNORMAL HIGH (ref 4.0–10.5)
nRBC: 0 % (ref 0.0–0.2)

## 2023-01-09 LAB — LEGIONELLA PNEUMOPHILA SEROGP 1 UR AG: L. pneumophila Serogp 1 Ur Ag: NEGATIVE

## 2023-01-09 LAB — HIV ANTIBODY (ROUTINE TESTING W REFLEX): HIV Screen 4th Generation wRfx: NONREACTIVE

## 2023-01-09 LAB — CULTURE, RESPIRATORY W GRAM STAIN

## 2023-01-09 MED ORDER — DOXYCYCLINE HYCLATE 100 MG PO TABS
100.0000 mg | ORAL_TABLET | Freq: Two times a day (BID) | ORAL | Status: DC
  Administered 2023-01-09 – 2023-01-11 (×5): 100 mg via ORAL
  Filled 2023-01-09 (×5): qty 1

## 2023-01-09 MED ORDER — CHLORHEXIDINE GLUCONATE CLOTH 2 % EX PADS
6.0000 | MEDICATED_PAD | Freq: Every day | CUTANEOUS | Status: DC
  Administered 2023-01-10 – 2023-01-11 (×2): 6 via TOPICAL

## 2023-01-09 MED ORDER — PANTOPRAZOLE SODIUM 40 MG PO TBEC
40.0000 mg | DELAYED_RELEASE_TABLET | Freq: Every day | ORAL | Status: DC
  Administered 2023-01-09 – 2023-01-11 (×3): 40 mg via ORAL
  Filled 2023-01-09 (×3): qty 1

## 2023-01-09 MED ORDER — TRAZODONE HCL 50 MG PO TABS
150.0000 mg | ORAL_TABLET | Freq: Every day | ORAL | Status: DC
  Administered 2023-01-09 – 2023-01-10 (×2): 150 mg via ORAL
  Filled 2023-01-09 (×2): qty 1

## 2023-01-09 MED ORDER — BUSPIRONE HCL 10 MG PO TABS
15.0000 mg | ORAL_TABLET | Freq: Two times a day (BID) | ORAL | Status: DC
  Administered 2023-01-09 – 2023-01-11 (×5): 15 mg via ORAL
  Filled 2023-01-09 (×5): qty 2

## 2023-01-09 MED ORDER — OLANZAPINE 5 MG PO TABS
5.0000 mg | ORAL_TABLET | Freq: Every day | ORAL | Status: DC
  Administered 2023-01-09 – 2023-01-10 (×2): 5 mg via ORAL
  Filled 2023-01-09 (×2): qty 1

## 2023-01-09 MED ORDER — PREGABALIN 75 MG PO CAPS
200.0000 mg | ORAL_CAPSULE | Freq: Three times a day (TID) | ORAL | Status: DC
  Administered 2023-01-09 – 2023-01-11 (×6): 200 mg via ORAL
  Filled 2023-01-09 (×6): qty 1

## 2023-01-09 MED ORDER — MONTELUKAST SODIUM 10 MG PO TABS
10.0000 mg | ORAL_TABLET | Freq: Every day | ORAL | Status: DC
  Administered 2023-01-09 – 2023-01-10 (×2): 10 mg via ORAL
  Filled 2023-01-09 (×2): qty 1

## 2023-01-09 NOTE — Assessment & Plan Note (Signed)
Continue Lyrica and psychiatric medication

## 2023-01-09 NOTE — Assessment & Plan Note (Signed)
PPI ?

## 2023-01-09 NOTE — Assessment & Plan Note (Signed)
Present on admission with pneumonia right lower lobe, tachycardia and leukocytosis.  I think the staph epidermidis is a skin contaminant.  I wanted to repeat blood cultures and patient was agreeable but then she declined when they came to draw the blood.  Patient was agreeable to have blood cultures drawn through her port.  Continue Rocephin and doxycycline.  Follow-up sputum culture.

## 2023-01-09 NOTE — Assessment & Plan Note (Signed)
Follow-up with [facility] as outpatient.

## 2023-01-09 NOTE — Plan of Care (Signed)

## 2023-01-09 NOTE — Hospital Course (Signed)
57 y.o. female with medical history significant of COPD, chronic respiratory failure on 2 L, esophageal cancer followed by [facility] oncology, GERD presenting with acute on chronic respiratory failure with hypoxia, sepsis, pneumonia, COPD exacerbation.  Patient reports increased worsening shortness of breath over the past 1 week.  Positive cough, wheezing, increased sputum production.  Positive generalized malaise.  Shortness of breath is progressively worsened over this timeframe.  No reported sick contacts.  Longstanding prior smoking history.  No longer smoking.  No chest pain.  No abdominal pain nausea vomiting.  No hemiparesis or confusion.  Has been using home inhalers with minimal improvement in symptoms Presented to the ER afebrile, otherwise hemodynamically stable with intermittent heart rate into the 100s, blood pressures 80s to 100s over 60s to 90s.  Satting in the upper 80s on 2 L, transition to 4+ liters nasal cannula.  White count 16, hemoglobin 10.5, platelets of 370, creatinine 0.76, glucose 152.  Chest x-ray with mild to moderate severe bilateral lower lobe airspace disease.  7/8.  Staph epidermidis growing out of 1 set of blood cultures.  Patient was agreeable to repeating blood cultures until they came to draw then declined.  Unable to come off oxygen during the day. 7/9.  Unable to come off oxygen during the day.  Patient agreeable to repeat blood cultures through her port but not a peripheral stick.  Patient agreeable to stay 1 more day but will likely want to go home tomorrow.

## 2023-01-09 NOTE — ED Notes (Signed)
Pt refusing peripheral sticks for blood cultures. MD aware.

## 2023-01-09 NOTE — Progress Notes (Signed)
Progress Note   Patient: Haley Hebert [id number] DOB: 10/13/1965 DOA: 01/08/2023     1 DOS: the patient was seen and examined on 01/09/2023   Brief hospital course: 57 y.o. female with medical history significant of COPD, chronic respiratory failure on 2 L, esophageal cancer followed by [facility] oncology, GERD presenting with acute on chronic respiratory failure with hypoxia, sepsis, pneumonia, COPD exacerbation.  Patient reports increased worsening shortness of breath over the past 1 week.  Positive cough, wheezing, increased sputum production.  Positive generalized malaise.  Shortness of breath is progressively worsened over this timeframe.  No reported sick contacts.  Longstanding prior smoking history.  No longer smoking.  No chest pain.  No abdominal pain nausea vomiting.  No hemiparesis or confusion.  Has been using home inhalers with minimal improvement in symptoms Presented to the ER afebrile, otherwise hemodynamically stable with intermittent heart rate into the 100s, blood pressures 80s to 100s over 60s to 90s.  Satting in the upper 80s on 2 L, transition to 4+ liters nasal cannula.  White count 16, hemoglobin 10.5, platelets of 370, creatinine 0.76, glucose 152.  Chest x-ray with mild to moderate severe bilateral lower lobe airspace disease.  7/8.  Staph epidermidis growing out of 1 set of blood cultures.  Patient was agreeable to repeating blood cultures until they came to draw then declined.  Patient feeling a lot better and wants to go home tomorrow.  Assessment and Plan: * Sepsis due to pneumonia [facility]) Present on admission with pneumonia right lower lobe, tachycardia and leukocytosis.  I think the staph epidermidis is a skin contaminant.  I wanted to repeat blood cultures and patient was agreeable but then she declined when they came to draw the blood.  Continue Rocephin and doxycycline.  Follow-up sputum culture.   Acute on chronic respiratory failure with hypoxia [facility]) Patient with  initial pulse ox of 89% on room air.  Patient states that she only wears oxygen at night.  Will try to get off oxygen today.   COPD (chronic obstructive pulmonary disease) (HCC) IV Solu-Medrol and nebulizer treatments.  Esophageal cancer [facility]) Follow-up with [facility] as outpatient.  GERD (gastroesophageal reflux disease) PPI  Fibromyalgia Continue Lyrica and psychiatric medication        Subjective: Patient has positive blood cultures with staph epidermatitis and 1 set of blood cultures.  Patient was agreeable to blood cultures but then refused when they came in to draw.  Patient feeling okay and wants to go home tomorrow.  She came in because she could not breathe but now breathing better.  Physical Exam: Vitals:   01/09/23 1130 01/09/23 1145 01/09/23 1200 01/09/23 1227  BP: (!) 90/53  90/68 93/64  Pulse: 66  (!) 59 (!) 56  Resp: 20  18 15   Temp:    97.9 F (36.6 C)  TempSrc:      SpO2: 95% 94%  92%  Weight:      Height:       Physical Exam HENT:     Head: Normocephalic.     Mouth/Throat:     Pharynx: No oropharyngeal exudate.  Eyes:     General: Lids are normal.     Conjunctiva/sclera: Conjunctivae normal.  Cardiovascular:     Rate and Rhythm: Normal rate and regular rhythm.     Heart sounds: Normal heart sounds, S1 normal and S2 normal.  Pulmonary:     Breath sounds: Examination of the right-lower field reveals decreased breath sounds and rhonchi. Examination of  the left-lower field reveals decreased breath sounds and rhonchi. Decreased breath sounds and rhonchi present. No wheezing or rales.  Abdominal:     Palpations: Abdomen is soft.     Tenderness: There is no abdominal tenderness.  Musculoskeletal:     Right lower leg: No swelling.     Left lower leg: No swelling.  Skin:    General: Skin is warm.     Findings: No rash.  Neurological:     Mental Status: She is alert and oriented to person, place, and time.     Data Reviewed: Sodium 135, creatinine  0.67, white blood cell count 13.1, hemoglobin 9.0, platelet count 319  Family Communication: Spoke with husband and daughter at the bedside  Disposition: Status is: Inpatient Remains inpatient appropriate because: Will try to get off oxygen.  Continue IV Rocephin and oral doxycycline  Planned Discharge Destination: Home    Time spent: 28 minutes  Author: Alford Highland, MD 01/09/2023 12:58 PM  For on call review [email or web address].

## 2023-01-09 NOTE — Assessment & Plan Note (Signed)
Prednisone switched back to IV Solu-Medrol.  Nebulizer treatments and Anoro inhaler.

## 2023-01-09 NOTE — Assessment & Plan Note (Signed)
Patient with initial pulse ox of 89% on room air.  Patient states that she only wears oxygen at night.  Will try to get off oxygen today.

## 2023-01-10 DIAGNOSIS — C159 Malignant neoplasm of esophagus, unspecified: Secondary | ICD-10-CM | POA: Diagnosis not present

## 2023-01-10 DIAGNOSIS — D649 Anemia, unspecified: Secondary | ICD-10-CM

## 2023-01-10 DIAGNOSIS — J9621 Acute and chronic respiratory failure with hypoxia: Secondary | ICD-10-CM | POA: Diagnosis not present

## 2023-01-10 DIAGNOSIS — J189 Pneumonia, unspecified organism: Secondary | ICD-10-CM | POA: Diagnosis not present

## 2023-01-10 DIAGNOSIS — J441 Chronic obstructive pulmonary disease with (acute) exacerbation: Secondary | ICD-10-CM | POA: Diagnosis not present

## 2023-01-10 LAB — CBC
HCT: 24.8 % — ABNORMAL LOW (ref 36.0–46.0)
Hemoglobin: 7.8 g/dL — ABNORMAL LOW (ref 12.0–15.0)
MCH: 25.2 pg — ABNORMAL LOW (ref 26.0–34.0)
MCHC: 31.5 g/dL (ref 30.0–36.0)
MCV: 80.3 fL (ref 80.0–100.0)
Platelets: 306 10*3/uL (ref 150–400)
RBC: 3.09 MIL/uL — ABNORMAL LOW (ref 3.87–5.11)
RDW: 14.8 % (ref 11.5–15.5)
WBC: 9.2 10*3/uL (ref 4.0–10.5)
nRBC: 0 % (ref 0.0–0.2)

## 2023-01-10 LAB — CULTURE, RESPIRATORY W GRAM STAIN

## 2023-01-10 LAB — PROCALCITONIN: Procalcitonin: 0.51 ng/mL

## 2023-01-10 MED ORDER — LACTATED RINGERS IV SOLN: INTRAVENOUS | Status: DC

## 2023-01-10 MED ORDER — SENNA 8.6 MG PO TABS
1.0000 | ORAL_TABLET | Freq: Every evening | ORAL | Status: DC | PRN
  Administered 2023-01-10: 8.6 mg via ORAL
  Filled 2023-01-10: qty 1

## 2023-01-10 MED ORDER — DOCUSATE SODIUM 100 MG PO CAPS
100.0000 mg | ORAL_CAPSULE | Freq: Two times a day (BID) | ORAL | Status: DC
  Administered 2023-01-10 – 2023-01-11 (×2): 100 mg via ORAL
  Filled 2023-01-10 (×2): qty 1

## 2023-01-10 MED ORDER — ALBUTEROL SULFATE (2.5 MG/3ML) 0.083% IN NEBU
2.5000 mg | INHALATION_SOLUTION | Freq: Four times a day (QID) | RESPIRATORY_TRACT | Status: DC
  Administered 2023-01-10 – 2023-01-11 (×5): 2.5 mg via RESPIRATORY_TRACT
  Filled 2023-01-10 (×5): qty 3

## 2023-01-10 MED ORDER — METHYLPREDNISOLONE SODIUM SUCC 40 MG IJ SOLR
40.0000 mg | Freq: Every day | INTRAMUSCULAR | Status: DC
  Administered 2023-01-11: 40 mg via INTRAVENOUS
  Filled 2023-01-10: qty 1

## 2023-01-10 MED ORDER — IPRATROPIUM-ALBUTEROL 0.5-2.5 (3) MG/3ML IN SOLN
3.0000 mL | Freq: Four times a day (QID) | RESPIRATORY_TRACT | Status: DC
  Administered 2023-01-10: 3 mL via RESPIRATORY_TRACT
  Filled 2023-01-10: qty 3

## 2023-01-10 MED ORDER — METHYLPREDNISOLONE SODIUM SUCC 40 MG IJ SOLR
40.0000 mg | Freq: Once | INTRAMUSCULAR | Status: AC
  Administered 2023-01-10: 40 mg via INTRAVENOUS
  Filled 2023-01-10: qty 1

## 2023-01-10 MED ORDER — UMECLIDINIUM-VILANTEROL 62.5-25 MCG/ACT IN AEPB
1.0000 | INHALATION_SPRAY | Freq: Every day | RESPIRATORY_TRACT | Status: DC
  Administered 2023-01-10 – 2023-01-11 (×2): 1 via RESPIRATORY_TRACT
  Filled 2023-01-10: qty 14

## 2023-01-10 MED ORDER — SODIUM CHLORIDE 0.9 % IV BOLUS
500.0000 mL | Freq: Once | INTRAVENOUS | Status: AC
  Administered 2023-01-10: 500 mL via INTRAVENOUS

## 2023-01-10 NOTE — Progress Notes (Signed)
Progress Note   Patient: Haley Hebert [id number] DOB: 07/16/1965 DOA: 01/08/2023     2 DOS: the patient was seen and examined on 01/10/2023   Brief hospital course: 57 y.o. female with medical history significant of COPD, chronic respiratory failure on 2 L, esophageal cancer followed by [facility] oncology, GERD presenting with acute on chronic respiratory failure with hypoxia, sepsis, pneumonia, COPD exacerbation.  Patient reports increased worsening shortness of breath over the past 1 week.  Positive cough, wheezing, increased sputum production.  Positive generalized malaise.  Shortness of breath is progressively worsened over this timeframe.  No reported sick contacts.  Longstanding prior smoking history.  No longer smoking.  No chest pain.  No abdominal pain nausea vomiting.  No hemiparesis or confusion.  Has been using home inhalers with minimal improvement in symptoms Presented to the ER afebrile, otherwise hemodynamically stable with intermittent heart rate into the 100s, blood pressures 80s to 100s over 60s to 90s.  Satting in the upper 80s on 2 L, transition to 4+ liters nasal cannula.  White count 16, hemoglobin 10.5, platelets of 370, creatinine 0.76, glucose 152.  Chest x-ray with mild to moderate severe bilateral lower lobe airspace disease.  7/8.  Staph epidermidis growing out of 1 set of blood cultures.  Patient was agreeable to repeating blood cultures until they came to draw then declined.  Unable to come off oxygen during the day. 7/9.  Unable to come off oxygen during the day.  Patient agreeable to repeat blood cultures through her port but not a peripheral stick.  Patient agreeable to stay 1 more day but will likely want to go home tomorrow.   Assessment and Plan: * Sepsis due to pneumonia [facility]) Present on admission with pneumonia right lower lobe, tachycardia and leukocytosis.  I think the staph epidermidis is a skin contaminant.  I wanted to repeat blood cultures and patient was  agreeable but then she declined when they came to draw the blood.  Patient was agreeable to have blood cultures drawn through her port.  Continue Rocephin and doxycycline.  Follow-up sputum culture.   Acute on chronic respiratory failure with hypoxia [facility]) Patient with initial pulse ox of 89% on room air.  Patient states that she only wears oxygen at night.  Unable to come off oxygen during the day yesterday and again today.  Dropped down into the mid 80s.   COPD with acute exacerbation (HCC) Prednisone switched back to IV Solu-Medrol.  Nebulizer treatments and Anoro inhaler.  Esophageal cancer [facility]) Follow-up with [facility] as outpatient.  GERD (gastroesophageal reflux disease) PPI  Fibromyalgia Continue Lyrica and psychiatric medication  Anemia, unspecified Hemoglobin dropped to 7.8 with IV fluid hydration.  Discontinue IV fluids.  Check hemoglobin again tomorrow with type and cross.        Subjective: Patient states that she feels okay and interested in going home.  She was agreeable to stay an extra night in the hospital but will likely want to go home tomorrow.  More wheeze heard today.  Still with cough.  Unable to come off oxygen during the day.  Physical Exam: Vitals:   01/10/23 0137 01/10/23 0257 01/10/23 0844 01/10/23 1141  BP: (!) 90/52 (!) 92/56 117/68   Pulse:   63   Resp:   18   Temp:   97.6 F (36.4 C)   TempSrc:      SpO2:   90% 92%  Weight:      Height:  Physical Exam HENT:     Head: Normocephalic.     Mouth/Throat:     Pharynx: No oropharyngeal exudate.  Eyes:     General: Lids are normal.     Conjunctiva/sclera: Conjunctivae normal.  Cardiovascular:     Rate and Rhythm: Normal rate and regular rhythm.     Heart sounds: Normal heart sounds, S1 normal and S2 normal.  Pulmonary:     Breath sounds: Examination of the right-middle field reveals decreased breath sounds and wheezing. Examination of the left-middle field reveals decreased breath sounds  and wheezing. Examination of the right-lower field reveals decreased breath sounds and rhonchi. Examination of the left-lower field reveals decreased breath sounds and rhonchi. Decreased breath sounds, wheezing and rhonchi present. No rales.  Abdominal:     Palpations: Abdomen is soft.     Tenderness: There is no abdominal tenderness.  Musculoskeletal:     Right lower leg: No swelling.     Left lower leg: No swelling.  Skin:    General: Skin is warm.     Findings: No rash.  Neurological:     Mental Status: She is alert and oriented to person, place, and time.     Data Reviewed: Hemoglobin down to 7.8 with IV fluid hydration, white blood cell count 9.2, platelet count 306, procalcitonin at 0.51, creatinine 0.67  Family Communication: Spoke with husband and daughter on their way to the room.  Disposition: Status is: Inpatient Remains inpatient appropriate because: Change prednisone back to Solu-Medrol and continue nebulizer treatments.  More bronchospasm today.  Unable to come off oxygen again today.  Planned Discharge Destination: Home    Time spent: 28 minutes  Author: Alford Highland, MD 01/10/2023 4:05 PM  For on call review [email or web address].

## 2023-01-10 NOTE — Assessment & Plan Note (Signed)
Hemoglobin dropped to 7.8 with IV fluid hydration.  Discontinue IV fluids.  Check hemoglobin again tomorrow with type and cross.

## 2023-01-10 NOTE — Progress Notes (Signed)
Patient c/o constipation. She reports having a small BM this morning. Attending on call notified.

## 2023-01-10 NOTE — Plan of Care (Signed)
Patient A&Ox4, from home, independent in room  

## 2023-01-10 NOTE — Progress Notes (Signed)
       CROSS COVER NOTE  NAME: Reality Clendenin MRN: [id number] DOB : [birth year]    Concern as stated by nurse / staff   S: Pt's BP/HR continue to be low B: Pt admitted with ShOB, CA PNA A: BP: 85/51 (63) Previously 90s/50-60s HR: high 40s R: Please let me know if you would like anything additional ordered.      Pertinent findings on chart review: Today's progress note reviewed: Patient admitted for sepsis secondary to pneumonia, currently on Rocephin and doxycycline Since 7/8 SBP has been consistently in the 80s and 90s, not currently on fluids    01/09/2023   11:11 PM 01/09/2023   12:27 PM 01/09/2023   12:00 PM  Vitals with BMI  Systolic 85 93 90  Diastolic 51 64 68  Pulse 63 56 59     Assessment and  Interventions   Assessment:  Hypotension  Plan: Recheck BP with manual cuff. Patient otherwise asymptomatic and pulse normal. If BP recheck still low, will order bolus Could be related to oxycodone and trazodone, recurrent sepsis not suspected. Not on BP meds

## 2023-01-11 DIAGNOSIS — J441 Chronic obstructive pulmonary disease with (acute) exacerbation: Secondary | ICD-10-CM | POA: Diagnosis not present

## 2023-01-11 DIAGNOSIS — J189 Pneumonia, unspecified organism: Secondary | ICD-10-CM | POA: Diagnosis not present

## 2023-01-11 DIAGNOSIS — J9621 Acute and chronic respiratory failure with hypoxia: Secondary | ICD-10-CM | POA: Diagnosis not present

## 2023-01-11 DIAGNOSIS — M797 Fibromyalgia: Secondary | ICD-10-CM | POA: Diagnosis not present

## 2023-01-11 LAB — CBC
HCT: 28.8 % — ABNORMAL LOW (ref 36.0–46.0)
Hemoglobin: 9.4 g/dL — ABNORMAL LOW (ref 12.0–15.0)
MCH: 25.7 pg — ABNORMAL LOW (ref 26.0–34.0)
MCHC: 32.6 g/dL (ref 30.0–36.0)
MCV: 78.7 fL — ABNORMAL LOW (ref 80.0–100.0)
Platelets: 386 10*3/uL (ref 150–400)
RBC: 3.66 MIL/uL — ABNORMAL LOW (ref 3.87–5.11)
RDW: 14.6 % (ref 11.5–15.5)
WBC: 16.4 10*3/uL — ABNORMAL HIGH (ref 4.0–10.5)
nRBC: 0 % (ref 0.0–0.2)

## 2023-01-11 LAB — CULTURE, RESPIRATORY W GRAM STAIN

## 2023-01-11 LAB — CULTURE, BLOOD (ROUTINE X 2): Special Requests: ADEQUATE

## 2023-01-11 LAB — IRON AND TIBC
Iron: 15 ug/dL — ABNORMAL LOW (ref 28–170)
Saturation Ratios: 5 % — ABNORMAL LOW (ref 10.4–31.8)
TIBC: 284 ug/dL (ref 250–450)
UIBC: 269 ug/dL

## 2023-01-11 LAB — TYPE AND SCREEN
ABO/RH(D): A NEG
Antibody Screen: NEGATIVE

## 2023-01-11 LAB — FERRITIN: Ferritin: 89 ng/mL (ref 11–307)

## 2023-01-11 MED ORDER — PREDNISONE 10 MG PO TABS: ORAL_TABLET | ORAL | 0 refills | Status: AC

## 2023-01-11 MED ORDER — DOXYCYCLINE HYCLATE 100 MG PO TABS: 100.0000 mg | ORAL_TABLET | Freq: Two times a day (BID) | ORAL | 0 refills | Status: DC

## 2023-01-11 MED ORDER — UMECLIDINIUM-VILANTEROL 62.5-25 MCG/ACT IN AEPB: 1.0000 | INHALATION_SPRAY | Freq: Every day | RESPIRATORY_TRACT | 3 refills | Status: DC

## 2023-01-11 MED ORDER — COMPRESSOR/NEBULIZER MISC: 1.0000 | Freq: Four times a day (QID) | 0 refills | Status: DC | PRN

## 2023-01-11 MED ORDER — IPRATROPIUM-ALBUTEROL 0.5-2.5 (3) MG/3ML IN SOLN: 3.0000 mL | Freq: Four times a day (QID) | RESPIRATORY_TRACT | 3 refills | Status: DC | PRN

## 2023-01-11 NOTE — TOC Progression Note (Signed)
Transition of Care [facility]) - Progression Note    Patient Details  Name: Haley Hebert MRN: [id number] Date of Birth: 21-Sep-1965  Transition of Care [facility]) CM/SW Contact  Garret Reddish, RN Phone Number: 01/11/2023, 2:56 PM  Clinical Narrative:   Chart reviewed.  Noted that patient has orders for discharge today.    Patient has requested a home nebulizer machine.  I have asked John with [employer] to provide patient home nebulizer machine.    Patient has no other TOC needs.        Barriers to Discharge: No Barriers Identified  Expected Discharge Plan and Services         Expected Discharge Date: 01/11/23               DME Arranged: Nebulizer machine DME Agency: [employer] Date DME Agency Contacted: 01/11/23 Time DME Agency Contacted: 1250 Representative spoke with at DME Agency: Jonny Ruiz             Social Determinants of Health (SDOH) Interventions SDOH Screenings   Food Insecurity: No Food Insecurity (01/09/2023)  Housing: Low Risk  (01/09/2023)  Transportation Needs: No Transportation Needs (01/09/2023)  Utilities: Not At Risk (01/09/2023)  Tobacco Use: High Risk (01/09/2023)    Readmission Risk Interventions     No data to display

## 2023-01-11 NOTE — Discharge Summary (Signed)
Physician Discharge Summary   Patient: Haley Hebert MRN: [id number] DOB: 1965-10-31  Admit date:     01/08/2023  Discharge date: 01/11/23  Discharge Physician: Marcelino Duster   PCP: Su Monks, PA   Recommendations at discharge:    PCP in 1 week.  Discharge Diagnoses: Principal Problem:   Sepsis due to pneumonia [facility]) Active Problems:   Acute on chronic respiratory failure with hypoxia (HCC)   COPD with acute exacerbation (HCC)   Esophageal cancer (HCC)   GERD (gastroesophageal reflux disease)   Fibromyalgia   Pneumonia   Anemia, unspecified  Resolved Problems:   * No resolved hospital problems. *  Hospital Course: 57 y.o. female with medical history significant of COPD, chronic respiratory failure on 2 L, esophageal cancer followed by [facility] oncology, GERD presenting with acute on chronic respiratory failure with hypoxia, sepsis, pneumonia, COPD exacerbation.  Patient reports increased worsening shortness of breath over the past 1 week.  Positive cough, wheezing, increased sputum production.  Positive generalized malaise.  Shortness of breath is progressively worsened over this timeframe.  No reported sick contacts.  Longstanding prior smoking history.  No longer smoking.  No chest pain.  No abdominal pain nausea vomiting.  No hemiparesis or confusion.  Has been using home inhalers with minimal improvement in symptoms Presented to the ER afebrile, otherwise hemodynamically stable with intermittent heart rate into the 100s, blood pressures 80s to 100s over 60s to 90s.  Satting in the upper 80s on 2 L, transition to 4+ liters nasal cannula.  White count 16, hemoglobin 10.5, platelets of 370, creatinine 0.76, glucose 152.  Chest x-ray with mild to moderate severe bilateral lower lobe airspace disease.  7/8.  Staph epidermidis growing out of 1 set of blood cultures.  Patient was agreeable to repeating blood cultures until they came to draw then declined.  Unable to come off  oxygen during the day. 7/9.  Unable to come off oxygen during the day.  Patient agreeable to repeat blood cultures through her port but not a peripheral stick.  Patient agreeable to stay 1 more day but will likely want to go home tomorrow. 7/10 - Patient is breathing better, wishes to go home. States she ambulated well with no need for oxygen. She asked for nebulizer treatment at home, work letter provided. She understands to follow up with PCP upon discharge.   Assessment and Plan: * Sepsis due to pneumonia [facility]) Present on admission with pneumonia right lower lobe, tachycardia and leukocytosis.  I think the staph epidermidis is a skin contaminant.  Repeat blood cultures so far negative. She was on Rocephin and doxycycline.  Will send her on Doxycycline therapy.  Acute on chronic respiratory failure with hypoxia [facility]) Patient with initial pulse ox of 89% on room air.  Patient states that she only wears oxygen at night.  She is off oxygen today, wishes to go home. Continue oral steroid taper.  COPD with acute exacerbation (HCC) Prednisone oral taper, Nebulizer treatments and Anoro inhaler outpatient.  Leukocytosis likely due to steroid use.  Outpatient follow-up suggested.  Esophageal cancer [facility]) Follow-up with [facility] as outpatient.  GERD (gastroesophageal reflux disease) PPI daily.  Fibromyalgia Continue Lyrica and psychiatric medication  Anemia, unspecified Hemoglobin stable, outpatient follow up suggested.  Patient understands to follow-up with PCP as outpatient.  Advised to be compliant with medications.       Consultants: None Procedures performed: None Disposition: Home Diet recommendation:  Regular diet DISCHARGE MEDICATION: Allergies as of 01/11/2023  No Known Allergies      Medication List     STOP taking these medications    ondansetron 8 MG disintegrating tablet Commonly known as: ZOFRAN-ODT   oxybutynin 15 MG 24 hr tablet Commonly known as: DITROPAN  XL   oxyCODONE 5 MG/5ML solution Commonly known as: ROXICODONE   pregabalin 200 MG capsule Commonly known as: LYRICA   tiZANidine 4 MG tablet Commonly known as: ZANAFLEX       TAKE these medications    busPIRone 15 MG tablet Commonly known as: BUSPAR Take 15 mg by mouth 2 (two) times daily.   cetirizine 10 MG tablet Commonly known as: ZYRTEC Take 10 mg by mouth daily as needed for rhinitis or allergies.   Combivent Respimat 20-100 MCG/ACT Aers respimat Generic drug: Ipratropium-Albuterol Inhale 1 puff into the lungs every 6 (six) hours as needed for shortness of breath or wheezing. What changed: Another medication with the same name was added. Make sure you understand how and when to take each.   ipratropium-albuterol 0.5-2.5 (3) MG/3ML Soln Commonly known as: DUONEB Take 3 mLs by nebulization every 6 (six) hours as needed. What changed: You were already taking a medication with the same name, and this prescription was added. Make sure you understand how and when to take each.   Compressor/Nebulizer Misc 1 each by Does not apply route every 6 (six) hours as needed.   doxycycline 100 MG tablet Commonly known as: VIBRA-TABS Take 1 tablet (100 mg total) by mouth 2 (two) times daily.   escitalopram 20 MG tablet Commonly known as: LEXAPRO Take 20 mg by mouth daily as needed.   fluticasone 50 MCG/ACT nasal spray Commonly known as: FLONASE Place 1 spray into both nostrils daily as needed for rhinitis or allergies.   lidocaine 5 % Commonly known as: LIDODERM Place 1 patch onto the skin daily.   methocarbamol 500 MG tablet Commonly known as: ROBAXIN Take 500 mg by mouth 4 (four) times daily as needed for muscle spasms.   montelukast 10 MG tablet Commonly known as: SINGULAIR TAKE 1 TABLET BY MOUTH EVERY DAY AT NIGHT   naloxone 4 MG/0.1ML Liqd nasal spray kit Commonly known as: NARCAN Place 1 spray into the nose once.   OLANZapine 5 MG tablet Commonly known as:  ZYPREXA Take 1 tablet by mouth at bedtime.   omeprazole 40 MG capsule Commonly known as: PRILOSEC Take 40 mg by mouth 2 (two) times daily.   ondansetron 8 MG tablet Commonly known as: ZOFRAN Take 8 mg by mouth every 12 (twelve) hours as needed for refractory nausea / vomiting, vomiting or nausea.   predniSONE 10 MG tablet Commonly known as: DELTASONE Take 4 tablets (40 mg total) by mouth daily for 2 days, THEN 3 tablets (30 mg total) daily for 2 days, THEN 2 tablets (20 mg total) daily for 2 days, THEN 1 tablet (10 mg total) daily for 2 days. Start taking on: January 11, 2023   raloxifene 60 MG tablet Commonly known as: EVISTA Take 60 mg by mouth daily.   traZODone 150 MG tablet Commonly known as: DESYREL TAKE 2 TABLETS (300 MG TOTAL) BY MOUTH NIGHTLY   umeclidinium-vilanterol 62.5-25 MCG/ACT Aepb Commonly known as: ANORO ELLIPTA Inhale 1 puff into the lungs daily. Start taking on: January 12, 2023        Discharge Exam: Ceasar Mons Weights   01/08/23 0308  Weight: 49.9 kg   General -middle-aged thin built Caucasian female, no apparent distress HEENT - PERRLA, EOMI,  atraumatic head, non tender sinuses. Lung - Clear, diffuse wheezes improved Heart - S1, S2 heard, no murmurs, rubs, no pedal edema Neuro - Alert, awake and oriented x 3, non focal exam. Skin - Warm and dry.  Condition at discharge: stable  The results of significant diagnostics from this hospitalization (including imaging, microbiology, ancillary and laboratory) are listed below for reference.   Imaging Studies: CT Angio Chest Pulmonary Embolism (PE) W or WO Contrast  Result Date: 01/08/2023 CLINICAL DATA:  Concern for pulmonary embolism. History of esophageal cancer with gastric pull up. * Tracking Code: BO * is EXAM: CT ANGIOGRAPHY CHEST WITH CONTRAST TECHNIQUE: Multidetector CT imaging of the chest was performed using the standard protocol during bolus administration of intravenous contrast. Multiplanar CT image  reconstructions and MIPs were obtained to evaluate the vascular anatomy. RADIATION DOSE REDUCTION: This exam was performed according to the departmental dose-optimization program which includes automated exposure control, adjustment of the mA and/or kV according to patient size and/or use of iterative reconstruction technique. CONTRAST:  75mL OMNIPAQUE IOHEXOL 350 MG/ML SOLN COMPARISON:  None Available. FINDINGS: Cardiovascular: No filling defects within the pulmonary arteries to suggest acute pulmonary embolism. Mediastinum/Nodes: Gastric pull-up anatomy following esophagectomy. No mediastinal lymphadenopathy. Trachea normal. Lungs/Pleura: There is bibasilar fine nodular pattern greater on the RIGHT. There is peribronchial thickening in lower lobes. Upper lungs relatively clear. No pleural fluid. Upper Abdomen: Limited view of the liver, kidneys, pancreas are unremarkable. Normal adrenal glands. LEFT periaortic lymphadenopathy just inferior to the LEFT renal vein measures 25 mm compared to 20 mm. Musculoskeletal: No aggressive osseous lesion. Review of the MIP images confirms the above findings. IMPRESSION: 1. No evidence acute pulmonary embolism. 2. Bibasilar fine nodular airspace disease greater on the RIGHT is favored pulmonary infection. Differential include typical and atypical pulmonary infections. Aspiration pneumonitis could have similar pattern but less favored. 3. Interval enlargement LEFT periaortic lymph node inferior to the LEFT renal vein. Consider FDG PET scan for further evaluation. Electronically Signed   By: Genevive Bi M.D.   On: 01/08/2023 10:25   DG Chest Portable 1 View  Result Date: 01/08/2023 CLINICAL DATA:  Shortness of breath. EXAM: PORTABLE CHEST 1 VIEW COMPARISON:  January 07, 2020 FINDINGS: A right-sided venous Port-A-Cath is seen with its distal tip noted at the junction of the superior vena cava and right atrium. The heart size and mediastinal contours are within normal limits.  The lungs are hyperinflated. Surgical sutures are seen along the suprahilar region on the right. Mild to moderate severity airspace disease is seen within the bilateral lower lobes. No pleural effusion or pneumothorax is identified. Radiopaque surgical clips are seen overlying the right upper quadrant. The visualized skeletal structures are unremarkable. IMPRESSION: Mild to moderate severity bilateral lower lobe airspace disease. This may be infectious in etiology, however, follow-up to resolution is recommended, as sequelae associated with an underlying neoplastic process cannot be excluded. Electronically Signed   By: Aram Candela M.D.   On: 01/08/2023 03:39    Microbiology: Results for orders placed or performed during the hospital encounter of 01/08/23  SARS Coronavirus 2 by RT PCR (hospital order, performed in [facility] hospital lab) *cepheid single result test* Anterior Nasal Swab     Status: None   Collection Time: 01/08/23  3:12 AM   Specimen: Anterior Nasal Swab  Result Value Ref Range Status   SARS Coronavirus 2 by RT PCR NEGATIVE NEGATIVE Final    Comment: (NOTE) SARS-CoV-2 target nucleic acids are NOT DETECTED.  The SARS-CoV-2 RNA is generally detectable in upper and lower respiratory specimens during the acute phase of infection. The lowest concentration of SARS-CoV-2 viral copies this assay can detect is 250 copies / mL. A negative result does not preclude SARS-CoV-2 infection and should not be used as the sole basis for treatment or other patient management decisions.  A negative result may occur with improper specimen collection / handling, submission of specimen other than nasopharyngeal swab, presence of viral mutation(s) within the areas targeted by this assay, and inadequate number of viral copies (<250 copies / mL). A negative result must be combined with clinical observations, patient history, and epidemiological information.  Fact Sheet for Patients:    RoadLapTop.co.za  Fact Sheet for Healthcare Providers: http://kim-miller.com/  This test is not yet approved or  cleared by the [place] FDA and has been authorized for detection and/or diagnosis of SARS-CoV-2 by FDA under an Emergency Use Authorization (EUA).  This EUA will remain in effect (meaning this test can be used) for the duration of the COVID-19 declaration under Section 564(b)(1) of the Act, 21 U.S.C. section 360bbb-3(b)(1), unless the authorization is terminated or revoked sooner.  Performed at [facility], [address] [place], [place] [address]   Blood culture (routine x 2)     Status: Abnormal   Collection Time: 01/08/23  4:17 AM   Specimen: BLOOD LEFT ARM  Result Value Ref Range Status   Specimen Description   Final    BLOOD LEFT ARM Performed at [facility], [address] [place], [place] [address]    Special Requests   Final    BOTTLES DRAWN AEROBIC AND ANAEROBIC Blood Culture adequate volume Performed at [facility], [address] [place], [place] [address]    Culture  Setup Time   Final    GRAM POSITIVE COCCI IN BOTH AEROBIC AND ANAEROBIC BOTTLES CRITICAL RESULT CALLED TO, READ BACK BY AND VERIFIED WITH: PHARMD JASON ROBINS [id number] AT 2232 BY [facility]    Culture (A)  Final    STAPHYLOCOCCUS EPIDERMIDIS THE SIGNIFICANCE OF ISOLATING THIS ORGANISM FROM A SINGLE SET OF BLOOD CULTURES WHEN MULTIPLE SETS ARE DRAWN IS UNCERTAIN. PLEASE NOTIFY THE MICROBIOLOGY DEPARTMENT WITHIN ONE WEEK IF SPECIATION AND SENSITIVITIES ARE REQUIRED. Performed at [facility], [address], [place], [place] [address]    Report Status 01/11/2023 FINAL  Final  Blood Culture ID Panel (Reflexed)     Status: Abnormal   Collection Time: 01/08/23  4:17 AM  Result Value Ref Range Status   Enterococcus faecalis NOT DETECTED NOT DETECTED Final   Enterococcus Faecium NOT DETECTED NOT DETECTED Final    Listeria monocytogenes NOT DETECTED NOT DETECTED Final   Staphylococcus species DETECTED (A) NOT DETECTED Final    Comment: CRITICAL RESULT CALLED TO, READ BACK BY AND VERIFIED WITH: JASON ROBINS @ 2232 01/08/23 [facility]    Staphylococcus aureus (BCID) NOT DETECTED NOT DETECTED Final   Staphylococcus epidermidis DETECTED (A) NOT DETECTED Final    Comment: CRITICAL RESULT CALLED TO, READ BACK BY AND VERIFIED WITH: JASON ROBINS @ 2232 01/08/23 [facility]    Staphylococcus lugdunensis NOT DETECTED NOT DETECTED Final   Streptococcus species NOT DETECTED NOT DETECTED Final   Streptococcus agalactiae NOT DETECTED NOT DETECTED Final   Streptococcus pneumoniae NOT DETECTED NOT DETECTED Final   Streptococcus pyogenes NOT DETECTED NOT DETECTED Final   A.calcoaceticus-baumannii NOT DETECTED NOT DETECTED Final   Bacteroides fragilis NOT DETECTED NOT DETECTED Final   Enterobacterales NOT  DETECTED NOT DETECTED Final   Enterobacter cloacae complex NOT DETECTED NOT DETECTED Final   Escherichia coli NOT DETECTED NOT DETECTED Final   Klebsiella aerogenes NOT DETECTED NOT DETECTED Final   Klebsiella oxytoca NOT DETECTED NOT DETECTED Final   Klebsiella pneumoniae NOT DETECTED NOT DETECTED Final   Proteus species NOT DETECTED NOT DETECTED Final   Salmonella species NOT DETECTED NOT DETECTED Final   Serratia marcescens NOT DETECTED NOT DETECTED Final   Haemophilus influenzae NOT DETECTED NOT DETECTED Final   Neisseria meningitidis NOT DETECTED NOT DETECTED Final   Pseudomonas aeruginosa NOT DETECTED NOT DETECTED Final   Stenotrophomonas maltophilia NOT DETECTED NOT DETECTED Final   Candida albicans NOT DETECTED NOT DETECTED Final   Candida auris NOT DETECTED NOT DETECTED Final   Candida glabrata NOT DETECTED NOT DETECTED Final   Candida krusei NOT DETECTED NOT DETECTED Final   Candida parapsilosis NOT DETECTED NOT DETECTED Final   Candida tropicalis NOT DETECTED NOT DETECTED Final   Cryptococcus neoformans/gattii  NOT DETECTED NOT DETECTED Final   Methicillin resistance mecA/C NOT DETECTED NOT DETECTED Final    Comment: Performed at [facility], [address] [place], [place] [address]  Respiratory (~20 pathogens) panel by PCR     Status: None   Collection Time: 01/08/23 12:12 PM   Specimen: Respiratory  Result Value Ref Range Status   Adenovirus NOT DETECTED NOT DETECTED Final   Coronavirus 229E NOT DETECTED NOT DETECTED Final    Comment: (NOTE) The Coronavirus on the Respiratory Panel, DOES NOT test for the novel  Coronavirus (2019 nCoV)    Coronavirus HKU1 NOT DETECTED NOT DETECTED Final   Coronavirus NL63 NOT DETECTED NOT DETECTED Final   Coronavirus OC43 NOT DETECTED NOT DETECTED Final   Metapneumovirus NOT DETECTED NOT DETECTED Final   Rhinovirus / Enterovirus NOT DETECTED NOT DETECTED Final   Influenza A NOT DETECTED NOT DETECTED Final   Influenza B NOT DETECTED NOT DETECTED Final   Parainfluenza Virus 1 NOT DETECTED NOT DETECTED Final   Parainfluenza Virus 2 NOT DETECTED NOT DETECTED Final   Parainfluenza Virus 3 NOT DETECTED NOT DETECTED Final   Parainfluenza Virus 4 NOT DETECTED NOT DETECTED Final   Respiratory Syncytial Virus NOT DETECTED NOT DETECTED Final   Bordetella pertussis NOT DETECTED NOT DETECTED Final   Bordetella Parapertussis NOT DETECTED NOT DETECTED Final   Chlamydophila pneumoniae NOT DETECTED NOT DETECTED Final   Mycoplasma pneumoniae NOT DETECTED NOT DETECTED Final    Comment: Performed at [facility], [address], [place], [place] [address]  Expectorated Sputum Assessment w Gram Stain, Rflx to Resp Cult     Status: None   Collection Time: 01/08/23 12:12 PM   Specimen: Sputum  Result Value Ref Range Status   Specimen Description SPUTUM  Final   Special Requests NONE  Final   Sputum evaluation   Final    THIS SPECIMEN IS ACCEPTABLE FOR SPUTUM CULTURE Performed at [facility], [address], [place], [place] [address]     Report Status 01/08/2023 FINAL  Final  Culture, Respiratory w Gram Stain     Status: None   Collection Time: 01/08/23 12:12 PM   Specimen: SPU  Result Value Ref Range Status   Specimen Description   Final    SPUTUM Performed at [facility], [address] [place], [place] [address]    Special Requests   Final    NONE Reflexed from [id number] Performed at [facility], [address] [place], [place] [address]    Gram Stain   Final    RARE SQUAMOUS EPITHELIAL CELLS PRESENT MODERATE WBC PRESENT, PREDOMINANTLY PMN MODERATE GRAM POSITIVE COCCI IN CLUSTERS INTRACELLULAR Performed at [facility], [address] [place], [place] [address]    Culture MODERATE STAPHYLOCOCCUS AUREUS  Final   Report Status 01/11/2023 FINAL  Final   Organism ID, Bacteria STAPHYLOCOCCUS AUREUS  Final      Susceptibility   Staphylococcus aureus - MIC*    CIPROFLOXACIN <=0.5 SENSITIVE Sensitive     ERYTHROMYCIN <=0.25 SENSITIVE Sensitive     GENTAMICIN <=0.5 SENSITIVE Sensitive     OXACILLIN <=0.25 SENSITIVE Sensitive     TETRACYCLINE <=1 SENSITIVE Sensitive     VANCOMYCIN 1 SENSITIVE Sensitive     TRIMETH/SULFA <=10 SENSITIVE Sensitive     CLINDAMYCIN <=0.25 SENSITIVE Sensitive     RIFAMPIN <=0.5 SENSITIVE Sensitive     Inducible Clindamycin NEGATIVE Sensitive     LINEZOLID 2 SENSITIVE Sensitive     * MODERATE STAPHYLOCOCCUS AUREUS    Labs: CBC: Recent Labs  Lab 01/08/23 0313 01/09/23 0537 01/10/23 0536 01/11/23 0925  WBC 15.9* 13.1* 9.2 16.4*  NEUTROABS 12.6*  --   --   --   HGB 10.5* 9.0* 7.8* 9.4*  HCT 32.9* 27.2* 24.8* 28.8*  MCV 79.1* 79.3* 80.3 78.7*  PLT 370 319 306 386   Basic Metabolic Panel: Recent Labs  Lab 01/08/23 0313 01/09/23 0537  NA 133* 135  K 3.4* 3.9  CL 97* 102  CO2 23 26  GLUCOSE 152* 149*  BUN 11 16  CREATININE 0.76 0.67  CALCIUM 8.9 8.5*  MG 1.8  --    Liver Function Tests: Recent Labs  Lab 01/08/23 0313 01/09/23 0537  AST  28 18  ALT 16 15  ALKPHOS 78 60  BILITOT 1.7* 0.2*  PROT 7.6 6.5  ALBUMIN 3.0* 2.4*   CBG: No results for input(s): "GLUCAP" in the last 168 hours.  Discharge time spent: greater than 30 minutes.  Signed: Marcelino Duster, MD [facility] 01/11/2023

## 2023-01-11 NOTE — Progress Notes (Signed)
1425 Port to right upper chest de accessed. Needle intact witnessed by another RN A. [employer] forces technical officer. Pt tolerated. No bleeding noted. Gauze and pressure dressing applied.

## 2023-01-11 NOTE — Progress Notes (Signed)
Patient discharged per MD orders at this time.All dc instructions,education & medications reviewed with the patient.Pt expressed understanding and will comply with discharge instructions.follow up appointments was also communicated to the patient.no verbal c/o or any ssx of distress.Pt was discharged home with HH/nursing services per order.Pt was transported home by spouse in a privately owned vehicle.

## 2023-01-11 NOTE — Plan of Care (Signed)
  Problem: Activity: Goal: Ability to tolerate increased activity will improve Outcome: Progressing   Problem: Clinical Measurements: Goal: Ability to maintain a body temperature in the normal range will improve Outcome: Progressing   Problem: Respiratory: Goal: Ability to maintain adequate ventilation will improve Outcome: Progressing Goal: Ability to maintain a clear airway will improve Outcome: Progressing   Problem: Education: Goal: Knowledge of General Education information will improve Description: Including pain rating scale, medication(s)/side effects and non-pharmacologic comfort measures Outcome: Progressing   Problem: Health Behavior/Discharge Planning: Goal: Ability to manage health-related needs will improve Outcome: Progressing   Problem: Clinical Measurements: Goal: Ability to maintain clinical measurements within normal limits will improve Outcome: Progressing Goal: Will remain free from infection Outcome: Progressing Goal: Diagnostic test results will improve Outcome: Progressing Goal: Respiratory complications will improve Outcome: Progressing Goal: Cardiovascular complication will be avoided Outcome: Progressing   Problem: Activity: Goal: Risk for activity intolerance will decrease Outcome: Progressing   Problem: Nutrition: Goal: Adequate nutrition will be maintained Outcome: Progressing   Problem: Coping: Goal: Level of anxiety will decrease Outcome: Progressing   Problem: Elimination: Goal: Will not experience complications related to bowel motility Outcome: Progressing Goal: Will not experience complications related to urinary retention Outcome: Progressing   Problem: Pain Managment: Goal: General experience of comfort will improve Outcome: Progressing   Problem: Safety: Goal: Ability to remain free from injury will improve Outcome: Progressing

## 2023-01-13 LAB — CULTURE, BLOOD (ROUTINE X 2): Special Requests: ADEQUATE

## 2023-01-14 LAB — CULTURE, BLOOD (ROUTINE X 2): Culture: NO GROWTH

## 2023-01-15 LAB — CULTURE, BLOOD (ROUTINE X 2)
Culture: NO GROWTH
Special Requests: ADEQUATE

## 2023-02-15 ENCOUNTER — Other Ambulatory Visit: Payer: Self-pay

## 2023-02-15 ENCOUNTER — Emergency Department: Payer: Medicaid Other

## 2023-02-15 ENCOUNTER — Inpatient Hospital Stay
Admission: EM | Admit: 2023-02-15 | Discharge: 2023-02-20 | DRG: 947 | Disposition: A | Discharge status: Home / Self Care | Payer: Medicaid Other | Attending: Internal Medicine | Admitting: Internal Medicine

## 2023-02-15 ENCOUNTER — Encounter: Payer: Self-pay | Admitting: *Deleted

## 2023-02-15 DIAGNOSIS — I10 Essential (primary) hypertension: Secondary | ICD-10-CM | POA: Diagnosis present

## 2023-02-15 DIAGNOSIS — R64 Cachexia: Secondary | ICD-10-CM | POA: Diagnosis present

## 2023-02-15 DIAGNOSIS — K219 Gastro-esophageal reflux disease without esophagitis: Secondary | ICD-10-CM | POA: Diagnosis present

## 2023-02-15 DIAGNOSIS — A040 Enteropathogenic Escherichia coli infection: Secondary | ICD-10-CM | POA: Diagnosis present

## 2023-02-15 DIAGNOSIS — R1084 Generalized abdominal pain: Secondary | ICD-10-CM

## 2023-02-15 DIAGNOSIS — D75838 Other thrombocytosis: Secondary | ICD-10-CM | POA: Diagnosis present

## 2023-02-15 DIAGNOSIS — F17210 Nicotine dependence, cigarettes, uncomplicated: Secondary | ICD-10-CM | POA: Diagnosis present

## 2023-02-15 DIAGNOSIS — R52 Pain, unspecified: Secondary | ICD-10-CM | POA: Diagnosis present

## 2023-02-15 DIAGNOSIS — J449 Chronic obstructive pulmonary disease, unspecified: Secondary | ICD-10-CM | POA: Diagnosis present

## 2023-02-15 DIAGNOSIS — E43 Unspecified severe protein-calorie malnutrition: Secondary | ICD-10-CM | POA: Diagnosis present

## 2023-02-15 DIAGNOSIS — G4700 Insomnia, unspecified: Secondary | ICD-10-CM | POA: Diagnosis present

## 2023-02-15 DIAGNOSIS — R109 Unspecified abdominal pain: Secondary | ICD-10-CM | POA: Diagnosis present

## 2023-02-15 DIAGNOSIS — E871 Hypo-osmolality and hyponatremia: Secondary | ICD-10-CM | POA: Diagnosis present

## 2023-02-15 DIAGNOSIS — D638 Anemia in other chronic diseases classified elsewhere: Secondary | ICD-10-CM | POA: Diagnosis present

## 2023-02-15 DIAGNOSIS — Z841 Family history of disorders of kidney and ureter: Secondary | ICD-10-CM

## 2023-02-15 DIAGNOSIS — D509 Iron deficiency anemia, unspecified: Secondary | ICD-10-CM | POA: Diagnosis present

## 2023-02-15 DIAGNOSIS — K2100 Gastro-esophageal reflux disease with esophagitis, without bleeding: Secondary | ICD-10-CM | POA: Diagnosis present

## 2023-02-15 DIAGNOSIS — C7989 Secondary malignant neoplasm of other specified sites: Secondary | ICD-10-CM | POA: Diagnosis present

## 2023-02-15 DIAGNOSIS — G629 Polyneuropathy, unspecified: Secondary | ICD-10-CM | POA: Diagnosis present

## 2023-02-15 DIAGNOSIS — Z681 Body mass index (BMI) 19 or less, adult: Secondary | ICD-10-CM

## 2023-02-15 DIAGNOSIS — D6869 Other thrombophilia: Secondary | ICD-10-CM | POA: Diagnosis present

## 2023-02-15 DIAGNOSIS — J9621 Acute and chronic respiratory failure with hypoxia: Secondary | ICD-10-CM | POA: Diagnosis present

## 2023-02-15 DIAGNOSIS — A044 Other intestinal Escherichia coli infections: Secondary | ICD-10-CM | POA: Insufficient documentation

## 2023-02-15 DIAGNOSIS — M797 Fibromyalgia: Secondary | ICD-10-CM | POA: Diagnosis present

## 2023-02-15 DIAGNOSIS — J9611 Chronic respiratory failure with hypoxia: Secondary | ICD-10-CM | POA: Insufficient documentation

## 2023-02-15 DIAGNOSIS — J189 Pneumonia, unspecified organism: Secondary | ICD-10-CM | POA: Diagnosis present

## 2023-02-15 DIAGNOSIS — D75839 Thrombocytosis, unspecified: Secondary | ICD-10-CM | POA: Insufficient documentation

## 2023-02-15 DIAGNOSIS — Z79899 Other long term (current) drug therapy: Secondary | ICD-10-CM

## 2023-02-15 DIAGNOSIS — C159 Malignant neoplasm of esophagus, unspecified: Secondary | ICD-10-CM | POA: Diagnosis present

## 2023-02-15 DIAGNOSIS — G893 Neoplasm related pain (acute) (chronic): Principal | ICD-10-CM | POA: Diagnosis present

## 2023-02-15 DIAGNOSIS — D649 Anemia, unspecified: Secondary | ICD-10-CM | POA: Diagnosis present

## 2023-02-15 DIAGNOSIS — Z8249 Family history of ischemic heart disease and other diseases of the circulatory system: Secondary | ICD-10-CM

## 2023-02-15 LAB — CBC WITH DIFFERENTIAL/PLATELET
Abs Immature Granulocytes: 0.1 10*3/uL — ABNORMAL HIGH (ref 0.00–0.07)
Basophils Absolute: 0.1 10*3/uL (ref 0.0–0.1)
Basophils Relative: 1 %
Eosinophils Absolute: 0.1 10*3/uL (ref 0.0–0.5)
Eosinophils Relative: 0 %
HCT: 32.6 % — ABNORMAL LOW (ref 36.0–46.0)
Hemoglobin: 10.4 g/dL — ABNORMAL LOW (ref 12.0–15.0)
Immature Granulocytes: 1 %
Lymphocytes Relative: 11 %
Lymphs Abs: 1.5 10*3/uL (ref 0.7–4.0)
MCH: 23.9 pg — ABNORMAL LOW (ref 26.0–34.0)
MCHC: 31.9 g/dL (ref 30.0–36.0)
MCV: 74.9 fL — ABNORMAL LOW (ref 80.0–100.0)
Monocytes Absolute: 0.7 10*3/uL (ref 0.1–1.0)
Monocytes Relative: 5 %
Neutro Abs: 11.5 10*3/uL — ABNORMAL HIGH (ref 1.7–7.7)
Neutrophils Relative %: 82 %
Platelets: 518 10*3/uL — ABNORMAL HIGH (ref 150–400)
RBC: 4.35 MIL/uL (ref 3.87–5.11)
RDW: 14.8 % (ref 11.5–15.5)
WBC: 14 10*3/uL — ABNORMAL HIGH (ref 4.0–10.5)
nRBC: 0 % (ref 0.0–0.2)

## 2023-02-15 LAB — CBC
HCT: 30.1 % — ABNORMAL LOW (ref 36.0–46.0)
Hemoglobin: 9.8 g/dL — ABNORMAL LOW (ref 12.0–15.0)
MCH: 24 pg — ABNORMAL LOW (ref 26.0–34.0)
MCHC: 32.6 g/dL (ref 30.0–36.0)
MCV: 73.6 fL — ABNORMAL LOW (ref 80.0–100.0)
Platelets: 446 10*3/uL — ABNORMAL HIGH (ref 150–400)
RBC: 4.09 MIL/uL (ref 3.87–5.11)
RDW: 14.9 % (ref 11.5–15.5)
WBC: 11.8 10*3/uL — ABNORMAL HIGH (ref 4.0–10.5)
nRBC: 0 % (ref 0.0–0.2)

## 2023-02-15 LAB — TROPONIN I (HIGH SENSITIVITY): Troponin I (High Sensitivity): 3 ng/L (ref <18)

## 2023-02-15 LAB — LACTIC ACID, PLASMA
Lactic Acid, Venous: 0.7 mmol/L (ref 0.5–1.9)
Lactic Acid, Venous: 0.9 mmol/L (ref 0.5–1.9)

## 2023-02-15 LAB — COMPREHENSIVE METABOLIC PANEL
ALT: 13 U/L (ref 0–44)
AST: 19 U/L (ref 15–41)
Albumin: 2.7 g/dL — ABNORMAL LOW (ref 3.5–5.0)
Alkaline Phosphatase: 64 U/L (ref 38–126)
Anion gap: 8 (ref 5–15)
BUN: 9 mg/dL (ref 6–20)
CO2: 29 mmol/L (ref 22–32)
Calcium: 8.4 mg/dL — ABNORMAL LOW (ref 8.9–10.3)
Chloride: 97 mmol/L — ABNORMAL LOW (ref 98–111)
Creatinine, Ser: 0.55 mg/dL (ref 0.44–1.00)
GFR, Estimated: >60 mL/min (ref >60)
Glucose, Bld: 105 mg/dL — ABNORMAL HIGH (ref 70–99)
Potassium: 3.9 mmol/L (ref 3.5–5.1)
Sodium: 134 mmol/L — ABNORMAL LOW (ref 135–145)
Total Bilirubin: 0.7 mg/dL (ref 0.3–1.2)
Total Protein: 6.8 g/dL (ref 6.5–8.1)

## 2023-02-15 LAB — LIPASE, BLOOD: Lipase: 38 U/L (ref 11–51)

## 2023-02-15 MED ORDER — RALOXIFENE HCL 60 MG PO TABS
60.0000 mg | ORAL_TABLET | Freq: Every day | ORAL | Status: DC
  Administered 2023-02-15 – 2023-02-20 (×6): 60 mg via ORAL
  Filled 2023-02-15 (×7): qty 1

## 2023-02-15 MED ORDER — SODIUM CHLORIDE 0.9 % IV SOLN
2.0000 g | Freq: Once | INTRAVENOUS | Status: AC
  Administered 2023-02-15: 2 g via INTRAVENOUS
  Filled 2023-02-15: qty 12.5

## 2023-02-15 MED ORDER — VANCOMYCIN HCL 750 MG/150ML IV SOLN
750.0000 mg | INTRAVENOUS | Status: DC
  Filled 2023-02-15: qty 150

## 2023-02-15 MED ORDER — MONTELUKAST SODIUM 10 MG PO TABS
10.0000 mg | ORAL_TABLET | Freq: Every day | ORAL | Status: DC
  Administered 2023-02-15 – 2023-02-19 (×5): 10 mg via ORAL
  Filled 2023-02-15 (×5): qty 1

## 2023-02-15 MED ORDER — UMECLIDINIUM-VILANTEROL 62.5-25 MCG/ACT IN AEPB
1.0000 | INHALATION_SPRAY | Freq: Every day | RESPIRATORY_TRACT | Status: DC
  Administered 2023-02-16 – 2023-02-20 (×5): 1 via RESPIRATORY_TRACT
  Filled 2023-02-15 (×2): qty 14

## 2023-02-15 MED ORDER — BUSPIRONE HCL 10 MG PO TABS
15.0000 mg | ORAL_TABLET | Freq: Two times a day (BID) | ORAL | Status: DC
  Administered 2023-02-15 – 2023-02-20 (×10): 15 mg via ORAL
  Filled 2023-02-15 (×10): qty 2

## 2023-02-15 MED ORDER — HEPARIN SODIUM (PORCINE) 5000 UNIT/ML IJ SOLN
5000.0000 [IU] | Freq: Three times a day (TID) | INTRAMUSCULAR | Status: DC
  Filled 2023-02-15 (×2): qty 1

## 2023-02-15 MED ORDER — SODIUM CHLORIDE 0.9 % IV BOLUS
1000.0000 mL | Freq: Once | INTRAVENOUS | Status: AC
  Administered 2023-02-15: 1000 mL via INTRAVENOUS

## 2023-02-15 MED ORDER — HYDROMORPHONE HCL 1 MG/ML IJ SOLN
1.0000 mg | Freq: Once | INTRAMUSCULAR | Status: AC
  Administered 2023-02-15: 1 mg via INTRAVENOUS
  Filled 2023-02-15: qty 1

## 2023-02-15 MED ORDER — OLANZAPINE 5 MG PO TABS
5.0000 mg | ORAL_TABLET | Freq: Every day | ORAL | Status: DC
  Administered 2023-02-15 – 2023-02-18 (×4): 5 mg via ORAL
  Filled 2023-02-15 (×5): qty 1

## 2023-02-15 MED ORDER — IOHEXOL 300 MG/ML  SOLN
80.0000 mL | Freq: Once | INTRAMUSCULAR | Status: AC | PRN
  Administered 2023-02-15: 80 mL via INTRAVENOUS

## 2023-02-15 MED ORDER — SODIUM CHLORIDE 0.9 % IV SOLN
2.0000 g | Freq: Two times a day (BID) | INTRAVENOUS | Status: AC
  Administered 2023-02-16 – 2023-02-18 (×6): 2 g via INTRAVENOUS
  Filled 2023-02-15 (×6): qty 12.5

## 2023-02-15 MED ORDER — IPRATROPIUM-ALBUTEROL 0.5-2.5 (3) MG/3ML IN SOLN: 3.0000 mL | Freq: Four times a day (QID) | RESPIRATORY_TRACT | Status: DC | PRN

## 2023-02-15 MED ORDER — MORPHINE SULFATE (PF) 2 MG/ML IV SOLN
2.0000 mg | INTRAVENOUS | Status: DC | PRN
  Administered 2023-02-15 – 2023-02-18 (×5): 2 mg via INTRAVENOUS
  Filled 2023-02-15 (×5): qty 1

## 2023-02-15 MED ORDER — TRAZODONE HCL 50 MG PO TABS
300.0000 mg | ORAL_TABLET | Freq: Every day | ORAL | Status: DC
  Administered 2023-02-15 – 2023-02-19 (×5): 300 mg via ORAL
  Filled 2023-02-15 (×6): qty 6

## 2023-02-15 MED ORDER — ESCITALOPRAM OXALATE 10 MG PO TABS
20.0000 mg | ORAL_TABLET | Freq: Every day | ORAL | Status: DC
  Administered 2023-02-15 – 2023-02-20 (×6): 20 mg via ORAL
  Filled 2023-02-15 (×6): qty 2

## 2023-02-15 MED ORDER — ACETAMINOPHEN 650 MG RE SUPP: 650.0000 mg | Freq: Four times a day (QID) | RECTAL | Status: DC | PRN

## 2023-02-15 MED ORDER — HYDROCODONE-ACETAMINOPHEN 5-325 MG PO TABS
1.0000 | ORAL_TABLET | ORAL | Status: DC | PRN
  Administered 2023-02-16 – 2023-02-19 (×12): 1 via ORAL
  Filled 2023-02-15 (×13): qty 1

## 2023-02-15 MED ORDER — ONDANSETRON HCL 4 MG/2ML IJ SOLN
4.0000 mg | Freq: Four times a day (QID) | INTRAMUSCULAR | Status: DC | PRN
  Administered 2023-02-15 – 2023-02-16 (×2): 4 mg via INTRAVENOUS
  Filled 2023-02-15 (×2): qty 2

## 2023-02-15 MED ORDER — SODIUM CHLORIDE 0.9% FLUSH
3.0000 mL | Freq: Two times a day (BID) | INTRAVENOUS | Status: DC
  Administered 2023-02-15 – 2023-02-20 (×8): 3 mL via INTRAVENOUS

## 2023-02-15 MED ORDER — SODIUM CHLORIDE 0.9 % IV SOLN: Freq: Once | INTRAVENOUS | Status: AC

## 2023-02-15 MED ORDER — ONDANSETRON HCL 4 MG/2ML IJ SOLN
4.0000 mg | Freq: Once | INTRAMUSCULAR | Status: AC
  Administered 2023-02-15: 4 mg via INTRAVENOUS
  Filled 2023-02-15: qty 2

## 2023-02-15 MED ORDER — VANCOMYCIN HCL IN DEXTROSE 1-5 GM/200ML-% IV SOLN
1000.0000 mg | Freq: Once | INTRAVENOUS | Status: AC
  Administered 2023-02-15: 1000 mg via INTRAVENOUS
  Filled 2023-02-15: qty 200

## 2023-02-15 MED ORDER — ACETAMINOPHEN 325 MG PO TABS: 650.0000 mg | ORAL_TABLET | Freq: Four times a day (QID) | ORAL | Status: DC | PRN

## 2023-02-15 MED ORDER — FLUTICASONE PROPIONATE 50 MCG/ACT NA SUSP: 1.0000 | Freq: Every day | NASAL | Status: DC | PRN

## 2023-02-15 NOTE — ED Notes (Signed)
Taking over care of pt at this time

## 2023-02-15 NOTE — Progress Notes (Signed)
Pharmacy Antibiotic Note  Haley Hebert is a 57 y.o. female admitted on 02/15/2023 with pneumonia.  Pharmacy has been consulted for Vanc, Cefepime  dosing.  Plan: Cefepime 2 gm IV X 1 given on 8/14 @ 1945. Cefepime 2 gm IV Q12H ordered to start on 8/15 @ 0800.  Vancomycin 1 gm IV X 1 given on 8/14 @ 2018. Vancomycin 750 mg IV Q24H ordered to start on 8/15 @ 2000.  AUC = 492.7 Vanc trough = 11.5   Height: 5\' 2"  (157.5 cm) Weight: 44 kg (97 lb) IBW/kg (Calculated) : 50.1  Temp (24hrs), Avg:98.1 F (36.7 C), Min:97.8 F (36.6 C), Max:98.4 F (36.9 C)  Recent Labs  Lab 02/15/23 1704 02/15/23 2101  WBC 14.0* 11.8*  CREATININE 0.55  --   LATICACIDVEN 0.7 0.9    Estimated Creatinine Clearance: 52.6 mL/min (by C-G formula based on SCr of 0.55 mg/dL).    No Known Allergies  Antimicrobials this admission:   >>    >>   Dose adjustments this admission:   Microbiology results:  BCx:   UCx:    Sputum:    MRSA PCR:   Thank you for allowing pharmacy to be a part of this patient's care.  , D 02/15/2023 10:46 PM

## 2023-02-15 NOTE — H&P (Signed)
History and Physical    Patient: Haley Hebert LKG:[id number] DOB: Nov 11, 1965 DOA: 02/15/2023 DOS: the patient was seen and examined on 02/16/2023 PCP: Su Monks, PA  Patient coming from: Home   Chief Complaint:  Chief Complaint  Patient presents with   Hernia    HPI: Haley Hebert is a 57 y.o. female with medical history significant for COPD, chronic respiratory failure, esophageal cancer followed by [facility] and worsening abdominal pain has a new hernia and was recently placed in a abdominal binder and is supposed to follow-up with [facility] for [facility].  Patient states that the abdominal pain is constant on relieving unrelenting and has no connection to food or worsening symptoms with not eating or eating and no anginal type of symptoms.  Patient has stopped smoking about 2 years ago.  No reports of hematemesis hemoptysis nausea vomiting diarrhea.  She is very weak and fatigued has malaise and has no energy.  No reports of any fever or any other complaints.  Is currently just waiting for her appointment with [facility] and admission is requested for patient's unbearable abdominal pain. In ED patient is ill-appearing thin cachectic malnourished. Labs show hyponatremia of 134 glucose 105 normal LFTs normal kidney function. CBC shows leukocytosis of 14 hemoglobin of 10 microcytosis of 74.9 platelets of 518. CT of the abdomen and pelvis shows: IMPRESSION: 1. Status post pull-through esophagectomy. 2. When compared to most recent imaging of the abdomen pelvis dated 04/06/2022, interval enlargement of a hypodense soft tissue mass in the vicinity of the gastroesophageal junction, abutting the gastric pull-through, the celiac axis, and the superior pancreatic head, measuring 4.4 x 3.4 cm, previously 3.3 x 2.9 cm. This is consistent with a worsening soft tissue metastasis. 3. Similar enlargement of matted appearing left retroperitoneal lymph nodes which remain concerning for nodal metastatic  disease. 4. Small volume ascites throughout the abdomen and pelvis, with new extensive peritoneal soft tissue nodularity, particularly throughout the left upper quadrant, consistent with peritoneal carcinomatosis. 5. Very extensive ground-glass and fine nodular airspace opacity throughout the lungs, which is improved in the lung bases when compared to most recent imaging of the chest dated 01/08/2023, but significantly worsened elsewhere throughout the lungs. Consolidative airspace opacity and bronchiolar plugging previously seen in the lung bases is improved. Findings are consistent with fluctuant, ongoing multifocal infection or inflammation. In the setting of chemotherapy or immunotherapy, drug toxicities a significant differential consideration for ground-glass airspace disease. 6. Unchanged enlargement of pretracheal lymph nodes, nonspecific, possibly reactive, nodal metastatic disease not excluded. 7. Multiple new although nonacute retracted, hypodense splenic infarctions. EKG shows normal sinus rhythm at 73 PR 134 QRS of 77 no ST-T wave changes.   Admission requested for abdominal pain, pneumonia. Review of Systems: Review of Systems  Constitutional:  Positive for malaise/fatigue.  Gastrointestinal:  Positive for abdominal pain and nausea.  Neurological:  Positive for weakness.  All other systems reviewed and are negative.   Past Medical History:  Diagnosis Date   Acid reflux    Asthma    Chronic gastritis 08/06/2018   Collapsed vertebra (HCC) 10/11/2018   lumbosacral   COPD (chronic obstructive pulmonary disease) (HCC)    Esophagitis, reflux 08/06/2018   Fibromyalgia    GERD (gastroesophageal reflux disease) 10/11/2018   Hypertension 10/11/2018   Lumbago with sciatica, left side 10/11/2018   Osteoporosis 10/11/2018   Past Surgical History:  Procedure Laterality Date   APPENDECTOMY     CHOLECYSTECTOMY     Social History:   reports that she  has been smoking. She has a 20  pack-year smoking history. She has never used smokeless tobacco. She reports that she does not currently use alcohol. She reports that she does not currently use drugs.  No Known Allergies  Family History  Problem Relation Age of Onset   Kidney disease Mother    Heart disease Mother    Heart attack Father     Prior to Admission medications   Medication Sig Start Date End Date Taking? Authorizing Provider  busPIRone (BUSPAR) 15 MG tablet Take 15 mg by mouth 2 (two) times daily. 12/24/18   [provider]  cetirizine (ZYRTEC) 10 MG tablet Take 10 mg by mouth daily as needed for rhinitis or allergies. 12/24/18   [provider]  COMBIVENT RESPIMAT 20-100 MCG/ACT AERS respimat Inhale 1 puff into the lungs every 6 (six) hours as needed for shortness of breath or wheezing. 12/10/21   [provider]  doxycycline (VIBRA-TABS) 100 MG tablet Take 1 tablet (100 mg total) by mouth 2 (two) times daily. 01/11/23   Marcelino Duster, MD  escitalopram (LEXAPRO) 20 MG tablet Take 20 mg by mouth daily as needed. 12/21/19   [provider]  fluticasone (FLONASE) 50 MCG/ACT nasal spray Place 1 spray into both nostrils daily as needed for rhinitis or allergies. 12/21/19   [provider]  ipratropium-albuterol (DUONEB) 0.5-2.5 (3) MG/3ML SOLN Take 3 mLs by nebulization every 6 (six) hours as needed. 01/11/23   Marcelino Duster, MD  lidocaine (LIDODERM) 5 % Place 1 patch onto the skin daily.    [provider]  methocarbamol (ROBAXIN) 500 MG tablet Take 500 mg by mouth 4 (four) times daily as needed for muscle spasms. 12/12/22   [provider]  montelukast (SINGULAIR) 10 MG tablet TAKE 1 TABLET BY MOUTH EVERY DAY AT NIGHT 12/24/18   [provider]  naloxone (NARCAN) nasal spray 4 mg/0.1 mL Place 1 spray into the nose once. 12/12/22   [provider]  Nebulizers (COMPRESSOR/NEBULIZER) MISC 1 each by Does not apply route every 6 (six)  hours as needed. 01/11/23   Marcelino Duster, MD  OLANZapine (ZYPREXA) 5 MG tablet Take 1 tablet by mouth at bedtime. 12/18/22   [provider]  omeprazole (PRILOSEC) 40 MG capsule Take 40 mg by mouth 2 (two) times daily. 12/24/18   [provider]  ondansetron (ZOFRAN) 8 MG tablet Take 8 mg by mouth every 12 (twelve) hours as needed for refractory nausea / vomiting, vomiting or nausea. 06/30/22   [provider]  raloxifene (EVISTA) 60 MG tablet Take 60 mg by mouth daily. 01/13/22   [provider]  traZODone (DESYREL) 150 MG tablet TAKE 2 TABLETS (300 MG TOTAL) BY MOUTH NIGHTLY 12/29/18   [provider]  umeclidinium-vilanterol (ANORO ELLIPTA) 62.5-25 MCG/ACT AEPB Inhale 1 puff into the lungs daily. 01/12/23   Marcelino Duster, MD     Vitals:   02/15/23 2100 02/15/23 2103 02/15/23 2158 02/15/23 2218  BP: 112/84   107/79  Pulse: 82   83  Resp:    18  Temp:    98 F (36.7 C)  TempSrc:      SpO2:  98% 99% 95%  Weight:      Height:       Physical Exam Vitals and nursing note reviewed.  Constitutional:      General: She is not in acute distress.    Appearance: She is cachectic. She is ill-appearing.  HENT:     Head:  Normocephalic and atraumatic.     Right Ear: Hearing normal.     Left Ear: Hearing normal.     Nose: Nose normal. No nasal deformity.     Mouth/Throat:     Lips: Pink.     Tongue: No lesions.     Pharynx: Oropharynx is clear.  Eyes:     General: Lids are normal.     Extraocular Movements: Extraocular movements intact.  Cardiovascular:     Rate and Rhythm: Normal rate and regular rhythm.     Heart sounds: Normal heart sounds.  Pulmonary:     Effort: Pulmonary effort is normal.     Breath sounds: Normal breath sounds.  Abdominal:     General: Bowel sounds are normal. There is no distension.     Palpations: Abdomen is soft. There is no mass.     Tenderness: There is abdominal tenderness. There is guarding.   Musculoskeletal:     Right lower leg: No edema.     Left lower leg: No edema.  Skin:    General: Skin is warm.  Neurological:     General: No focal deficit present.     Mental Status: She is alert and oriented to person, place, and time.     Cranial Nerves: Cranial nerves 2-12 are intact.  Psychiatric:        Attention and Perception: Attention normal.        Mood and Affect: Mood normal.        Speech: Speech normal.        Behavior: Behavior normal. Behavior is cooperative.      Labs on Admission: I have personally reviewed following labs and imaging studies  CBC: Recent Labs  Lab 02/15/23 1704 02/15/23 2101  WBC 14.0* 11.8*  NEUTROABS 11.5*  --   HGB 10.4* 9.8*  HCT 32.6* 30.1*  MCV 74.9* 73.6*  PLT 518* 446*   Basic Metabolic Panel: Recent Labs  Lab 02/15/23 1704  NA 134*  K 3.9  CL 97*  CO2 29  GLUCOSE 105*  BUN 9  CREATININE 0.55  CALCIUM 8.4*   GFR: Estimated Creatinine Clearance: 52.6 mL/min (by C-G formula based on SCr of 0.55 mg/dL). Liver Function Tests: Recent Labs  Lab 02/15/23 1704  AST 19  ALT 13  ALKPHOS 64  BILITOT 0.7  PROT 6.8  ALBUMIN 2.7*   Recent Labs  Lab 02/15/23 1704  LIPASE 38   No results for input(s): "AMMONIA" in the last 168 hours. Coagulation Profile: No results for input(s): "INR", "PROTIME" in the last 168 hours. Cardiac Enzymes: No results for input(s): "CKTOTAL", "CKMB", "CKMBINDEX", "TROPONINI" in the last 168 hours. BNP (last 3 results) No results for input(s): "PROBNP" in the last 8760 hours. HbA1C: No results for input(s): "HGBA1C" in the last 72 hours. CBG: No results for input(s): "GLUCAP" in the last 168 hours. Lipid Profile: No results for input(s): "CHOL", "HDL", "LDLCALC", "TRIG", "CHOLHDL", "LDLDIRECT" in the last 72 hours. Thyroid Function Tests: No results for input(s): "TSH", "T4TOTAL", "FREET4", "T3FREE", "THYROIDAB" in the last 72 hours. Anemia Panel: No results for input(s):  "VITAMINB12", "FOLATE", "FERRITIN", "TIBC", "IRON", "RETICCTPCT" in the last 72 hours. Urinalysis    Component Value Date/Time   COLORURINE YELLOW 03/15/2019 1321   APPEARANCEUR HAZY (A) 03/15/2019 1321   LABSPEC 1.025 03/15/2019 1321   PHURINE 5.0 03/15/2019 1321   GLUCOSEU NEGATIVE 03/15/2019 1321   HGBUR NEGATIVE 03/15/2019 1321   BILIRUBINUR SMALL (A) 03/15/2019 1321   KETONESUR TRACE (A)  03/15/2019 1321   PROTEINUR NEGATIVE 03/15/2019 1321   NITRITE NEGATIVE 03/15/2019 1321   LEUKOCYTESUR NEGATIVE 03/15/2019 1321      Unresulted Labs (From admission, onward)     Start     Ordered   02/16/23 0500  Comprehensive metabolic panel  Tomorrow morning,   R        02/15/23 1933   02/16/23 0500  CBC  Tomorrow morning,   R        02/15/23 1933   02/15/23 1709  Blood culture (single)  Once,   STAT        02/15/23 1709   02/15/23 1613  Urinalysis, Routine w reflex microscopic -Urine, Clean Catch  Once,   URGENT       Question:  Specimen Source  Answer:  Urine, Clean Catch   02/15/23 1612            Medications  busPIRone (BUSPAR) tablet 15 mg (15 mg Oral Given 02/15/23 2251)  montelukast (SINGULAIR) tablet 10 mg (10 mg Oral Given 02/15/23 2251)  escitalopram (LEXAPRO) tablet 20 mg (20 mg Oral Given 02/15/23 2251)  umeclidinium-vilanterol (ANORO ELLIPTA) 62.5-25 MCG/ACT 1 puff (has no administration in time range)  raloxifene (EVISTA) tablet 60 mg (60 mg Oral Given 02/15/23 2259)  OLANZapine (ZYPREXA) tablet 5 mg (5 mg Oral Given 02/15/23 2259)  fluticasone (FLONASE) 50 MCG/ACT nasal spray 1 spray (has no administration in time range)  ipratropium-albuterol (DUONEB) 0.5-2.5 (3) MG/3ML nebulizer solution 3 mL (has no administration in time range)  heparin injection 5,000 Units (5,000 Units Subcutaneous Patient Refused/Not Given 02/15/23 2254)  sodium chloride flush (NS) 0.9 % injection 3 mL (3 mLs Intravenous Given 02/15/23 2254)  acetaminophen (TYLENOL) tablet 650 mg (has no  administration in time range)    Or  acetaminophen (TYLENOL) suppository 650 mg (has no administration in time range)  HYDROcodone-acetaminophen (NORCO/VICODIN) 5-325 MG per tablet 1 tablet (has no administration in time range)  morphine (PF) 2 MG/ML injection 2 mg (2 mg Intravenous Given 02/15/23 2015)  ondansetron (ZOFRAN) injection 4 mg (4 mg Intravenous Given 02/15/23 2042)  traZODone (DESYREL) tablet 300 mg (300 mg Oral Given 02/15/23 2251)  ceFEPIme (MAXIPIME) 2 g in sodium chloride 0.9 % 100 mL IVPB (has no administration in time range)  vancomycin (VANCOREADY) IVPB 750 mg/150 mL (has no administration in time range)  HYDROmorphone (DILAUDID) injection 1 mg (1 mg Intravenous Given 02/15/23 1715)  ondansetron (ZOFRAN) injection 4 mg (4 mg Intravenous Given 02/15/23 1715)  iohexol (OMNIPAQUE) 300 MG/ML solution 80 mL (80 mLs Intravenous Contrast Given 02/15/23 1805)  HYDROmorphone (DILAUDID) injection 1 mg (1 mg Intravenous Given 02/15/23 1939)  0.9 %  sodium chloride infusion (0 mLs Intravenous Stopped 02/15/23 2151)  sodium chloride 0.9 % bolus 1,000 mL (0 mLs Intravenous Stopped 02/15/23 2042)  ceFEPIme (MAXIPIME) 2 g in sodium chloride 0.9 % 100 mL IVPB (0 g Intravenous Stopped 02/15/23 2015)  vancomycin (VANCOCIN) IVPB 1000 mg/200 mL premix (0 mg Intravenous Stopped 02/15/23 2118)    Radiological Exams on Admission: CT CHEST ABDOMEN PELVIS W CONTRAST  Result Date: 02/15/2023 CLINICAL DATA:  Sepsis, history of esophageal cancer status post gastroesophageal pull-through * Tracking Code: BO * EXAM: CT CHEST, ABDOMEN, AND PELVIS WITH CONTRAST TECHNIQUE: Multidetector CT imaging of the chest, abdomen and pelvis was performed following the standard protocol during bolus administration of intravenous contrast. RADIATION DOSE REDUCTION: This exam was performed according to the departmental dose-optimization program which includes automated exposure control, adjustment of the  mA and/or kV according to  patient size and/or use of iterative reconstruction technique. CONTRAST:  80mL OMNIPAQUE IOHEXOL 300 MG/ML  SOLN COMPARISON:  CT chest angiogram, 01/08/2023, CT abdomen pelvis GI bleed study, 04/06/2022 FINDINGS: CT CHEST FINDINGS Cardiovascular: Right chest port catheter. Scattered aortic atherosclerosis. Normal heart size. No pericardial effusion. Mediastinum/Nodes: Unchanged enlarged pretracheal lymph nodes measuring up to 1.7 x 1.2 cm (series 2, image 24). Status post pull-through esophagectomy. Gastric pull-through is fluid and debris-filled. Thyroid gland, trachea, and esophagus demonstrate no significant findings. Lungs/Pleura: Mild centrilobular and paraseptal emphysema. Diffuse bilateral bronchial wall thickening. Very extensive ground-glass and fine nodular airspace opacity throughout the lungs, which is improved in the lung bases but significantly worsened elsewhere throughout the lungs. Consolidative airspace opacity and bronchiolar plugging previously seen in the lung bases is improved. Musculoskeletal: No chest wall abnormality. No acute osseous findings. CT ABDOMEN PELVIS FINDINGS Hepatobiliary: No focal liver abnormality is seen. Status post cholecystectomy. Unchanged postoperative biliary ductal dilatation. Pancreas: Unremarkable. No pancreatic ductal dilatation or surrounding inflammatory changes. Spleen: Multiple new retracted, hypodense splenic infarctions (series 2, image 56). Adrenals/Urinary Tract: Adrenal glands are unremarkable. Kidneys are normal, without renal calculi, solid lesion, or hydronephrosis. Bladder is unremarkable. Stomach/Bowel: Status post pull-through esophagectomy. Interval enlargement of a hypodense soft tissue mass in the vicinity of the gastroesophageal junction, abutting the gastric pull-through, the celiac axis, and the superior pancreatic head, measuring 4.4 x 3.4 cm, previously 3.3 x 2.9 cm (series 2, image 51). Appendix not clearly visualized. No evidence of bowel  wall thickening, distention, or inflammatory changes. Vascular/Lymphatic: Aortic atherosclerosis. Similar enlargement of matted appearing left retroperitoneal lymph nodes measuring up to 2.4 x 2.3 cm (series 2, image 64). Reproductive: No mass or other abnormality. Other: No abdominal wall hernia or abnormality. Small volume ascites throughout the abdomen and pelvis, with new extensive peritoneal soft tissue nodularity, particularly throughout the left upper quadrant (series 2, image 56). Musculoskeletal: No acute osseous findings. IMPRESSION: 1. Status post pull-through esophagectomy. 2. When compared to most recent imaging of the abdomen pelvis dated 04/06/2022, interval enlargement of a hypodense soft tissue mass in the vicinity of the gastroesophageal junction, abutting the gastric pull-through, the celiac axis, and the superior pancreatic head, measuring 4.4 x 3.4 cm, previously 3.3 x 2.9 cm. This is consistent with a worsening soft tissue metastasis. 3. Similar enlargement of matted appearing left retroperitoneal lymph nodes which remain concerning for nodal metastatic disease. 4. Small volume ascites throughout the abdomen and pelvis, with new extensive peritoneal soft tissue nodularity, particularly throughout the left upper quadrant, consistent with peritoneal carcinomatosis. 5. Very extensive ground-glass and fine nodular airspace opacity throughout the lungs, which is improved in the lung bases when compared to most recent imaging of the chest dated 01/08/2023, but significantly worsened elsewhere throughout the lungs. Consolidative airspace opacity and bronchiolar plugging previously seen in the lung bases is improved. Findings are consistent with fluctuant, ongoing multifocal infection or inflammation. In the setting of chemotherapy or immunotherapy, drug toxicities a significant differential consideration for ground-glass airspace disease. 6. Unchanged enlargement of pretracheal lymph nodes,  nonspecific, possibly reactive, nodal metastatic disease not excluded. 7. Multiple new although nonacute retracted, hypodense splenic infarctions. Aortic Atherosclerosis (ICD10-I70.0) and Emphysema (ICD10-J43.9). Electronically Signed   By: Jearld Lesch M.D.   On: 02/15/2023 18:38     Data Reviewed: Relevant notes from primary care and specialist visits, past discharge summaries as available in EHR, including Care Everywhere. Prior diagnostic testing as pertinent to current admission diagnoses Updated  medications and problem lists for reconciliation ED course, including vitals, labs, imaging, treatment and response to treatment Triage notes, nursing and pharmacy notes and ED provider's notes Notable results as noted in HPI  Assessment and Plan: * Abdominal pain Pt has h/o cancer and has severe abd pain.  She is unable to move or sit up or rest due to the pain started pt on morphine 2 mg PRN.  She is waiting [facility] or other TC facility.  Pt is exquisitely tender to touch. She has ct with contrast. We will consider repeat ct with iv and po contrast.     Acute on chronic respiratory failure with hypoxia (HCC) SpO2: 95 % O2 Flow Rate (L/min): 2 L/min Stable.     Esophageal cancer [facility]) F/U with oncology at [facility].  Pt having abdominal pain and d/w pt that we will admit and try to manage and may need to be transferred. Continue vancomycin and cefepime per pharmacy protocol.      GERD (gastroesophageal reflux disease) IV PPI.  Prn zofran.   Anemia, unspecified    Latest Ref Rng & Units 02/15/2023    9:01 PM 02/15/2023    5:04 PM 01/11/2023    9:25 AM  CBC  WBC 4.0 - 10.5 K/uL 11.8  14.0  16.4   Hemoglobin 12.0 - 15.0 g/dL 9.8  16.1  9.4   Hematocrit 36.0 - 46.0 % 30.1  32.6  28.8   Platelets 150 - 400 K/uL 446  518  386   Stable currently no reports of any gross bleeding.   CAP (community acquired pneumonia) Continue patient on cefepime and vancomycin.   With pharmacy consult. Bedside swallow and aspiration and fall precautions.   Hypertension Vitals:   02/15/23 1606 02/15/23 1704 02/15/23 1730 02/15/23 1800  BP: 113/80 130/81 113/76 110/77   02/15/23 1830 02/15/23 1930 02/15/23 2044 02/15/23 2100  BP: 103/75 109/80 115/77 112/84   02/15/23 2218  BP: 107/79  Controlled. Old BP meds.     Prognosis: Poor  DVT prophylaxis:  Heparin  Consults:  None  Advance Care Planning:    Code Status: Full Code   Family Communication:  None  Disposition Plan:  Home  Severity of Illness: The appropriate patient status for this patient is OBSERVATION. Observation status is judged to be reasonable and necessary in order to provide the required intensity of service to ensure the patient's safety. The patient's presenting symptoms, physical exam findings, and initial radiographic and laboratory data in the context of their medical condition is felt to place them at decreased risk for further clinical deterioration. Furthermore, it is anticipated that the patient will be medically stable for discharge from the hospital within 2 midnights of admission.   Author: Gertha Calkin, MD 02/16/2023 3:23 AM  For on call review [email or web address].

## 2023-02-15 NOTE — ED Triage Notes (Signed)
Pt is here evaluation of hernia in mid upper abdomen which has been going on for 4 months.  Pt was seen by her oncologist who gave her an abdominal binder which initially helped but now she reports she has pain even with this on.  Hernia is reducible and soft.  Pt has hx of epophageal cancer.  Pt has been having difficulty eating due to this.

## 2023-02-15 NOTE — ED Notes (Signed)
Pt ambulated to restroom and back to bed safely. Pt requesting food, per hospitalist Allena Katz, okay for pt to eat; provided with meal box and ginger ale as requested.

## 2023-02-15 NOTE — ED Notes (Signed)
PT REFUSED LAB DRAWN. PT STATED, " I HAVE A PORT, IF IS NOT MY PORT THEN I DONT WANT IT." FIRST NURSE WILL BE NOTIFIED.

## 2023-02-15 NOTE — Hospital Course (Addendum)
57 year old female past medical history of esophageal cancer presented with midepigastric abdominal pain.  CT scan showed worsening soft tissue metastases compared to previous CT scan.    8/18.  Patient asking for better pain control upon going home.  Asking for long-acting pain medication.  Will start long-acting pain medication today.  For enteropathogenic E. coli will switch antibiotic over to Zithromax for 3 days. 8/19.  Switched patient to MS contin 15mg  po tid and msir 15 mg po q 4-6 pn pain.  5 day supply of medications prescribed.  Spoke with [facility] and they are able to call patient husband to get in for appointment on Wednesday.  Patient wanted to go home in the afternoon.

## 2023-02-15 NOTE — Consult Note (Signed)
PHARMACY -  BRIEF ANTIBIOTIC NOTE   Pharmacy has received consult(s) for sepsis from an ED provider.  The patient's profile has been reviewed for ht/wt/allergies/indication/available labs.    One time order(s) placed for cefepime and vancomycin  Further antibiotics/pharmacy consults should be ordered by admitting physician if indicated.                       Thank you, Ronnald Ramp 02/15/2023  7:17 PM

## 2023-02-15 NOTE — ED Provider Notes (Signed)
[facility] Provider Note    Event Date/Time   First MD Initiated Contact with Patient 02/15/23 1646     (approximate)   History   Hernia   HPI  Haley Hebert is a 57 y.o. female with history of COPD, chronic respiratory failure, esophageal cancer followed by [facility], GERD, here with shortness of breath, worsening abdominal pain.  The patient states that over the last several months, she has had a new hernia in her epigastric area.  Is been increasingly painful.  Her oncologist put her in a binder for this.  However, over the last week, she has had worsening pain here, unrelenting, severe despite her p.o. oxycodone 15 mg every several hours.  She has also had associated nausea, fatigue, and general malaise.  She has had difficulty eating and drinking.  She has had decreased appetite.  She feels generally fatigued.  The pain is unbearable.     Physical Exam   Triage Vital Signs: ED Triage Vitals  Encounter Vitals Group     BP 02/15/23 1606 113/80     Systolic BP Percentile --      Diastolic BP Percentile --      Pulse Rate 02/15/23 1606 85     Resp 02/15/23 1606 18     Temp 02/15/23 1606 98.4 F (36.9 C)     Temp Source 02/15/23 1606 Oral     SpO2 02/15/23 1606 99 %     Weight 02/15/23 1606 97 lb (44 kg)     Height 02/15/23 1606 5\' 2"  (1.575 m)     Head Circumference --      Peak Flow --      Pain Score 02/15/23 1611 10     Pain Loc --      Pain Education --      Exclude from Growth Chart --     Most recent vital signs: Vitals:   02/15/23 1800 02/15/23 1830  BP: 110/77 103/75  Pulse: 67 76  Resp:  18  Temp:    SpO2:       General: Awake, no distress.  CV:  Good peripheral perfusion.  Regular rate and rhythm. Resp:  Slight tachypnea with diminished aeration.  Bilateral basilar rhonchi. Abd:  No distention.  Significant epigastric discomfort with palpable ventral hernia, worse with Valsalva.  Reduces spontaneously when  resting. Other:  Dry mucous membranes.   ED Results / Procedures / Treatments   Labs (all labs ordered are listed, but only abnormal results are displayed) Labs Reviewed  COMPREHENSIVE METABOLIC PANEL - Abnormal; Notable for the following components:      Result Value   Sodium 134 (*)    Chloride 97 (*)    Glucose, Bld 105 (*)    Calcium 8.4 (*)    Albumin 2.7 (*)    All other components within normal limits  CBC WITH DIFFERENTIAL/PLATELET - Abnormal; Notable for the following components:   WBC 14.0 (*)    Hemoglobin 10.4 (*)    HCT 32.6 (*)    MCV 74.9 (*)    MCH 23.9 (*)    Platelets 518 (*)    Neutro Abs 11.5 (*)    Abs Immature Granulocytes 0.10 (*)    All other components within normal limits  CULTURE, BLOOD (SINGLE)  LIPASE, BLOOD  LACTIC ACID, PLASMA  URINALYSIS, ROUTINE W REFLEX MICROSCOPIC  LACTIC ACID, PLASMA  TROPONIN I (HIGH SENSITIVITY)     EKG Normal sinus rhythm, Triklo at 73.  PR 134, QRS 77, QTc 4 tumor acute ST elevation or depression no acute evidence of acute ischemia or infarct.   RADIOLOGY CT chest/abdomen/pelvis: Interval significant enlargement of soft tissue mass in the epigastric area worsening for soft tissue metastasis, enlarging left retroperitoneal lymph nodes, small volume ascites with new extensive peritoneal carcinomatosis.  Extensive bilateral lung findings concerning for multifocal pneumonia.   I also independently reviewed and agree with radiologist interpretations.   PROCEDURES:  Critical Care performed: No   MEDICATIONS ORDERED IN ED: Medications  HYDROmorphone (DILAUDID) injection 1 mg (has no administration in time range)  0.9 %  sodium chloride infusion (has no administration in time range)  sodium chloride 0.9 % bolus 1,000 mL (has no administration in time range)  ceFEPIme (MAXIPIME) 2 g in sodium chloride 0.9 % 100 mL IVPB (has no administration in time range)  vancomycin (VANCOCIN) IVPB 1000 mg/200 mL premix (has  no administration in time range)  HYDROmorphone (DILAUDID) injection 1 mg (1 mg Intravenous Given 02/15/23 1715)  ondansetron (ZOFRAN) injection 4 mg (4 mg Intravenous Given 02/15/23 1715)  iohexol (OMNIPAQUE) 300 MG/ML solution 80 mL (80 mLs Intravenous Contrast Given 02/15/23 1805)     IMPRESSION / MDM / ASSESSMENT AND PLAN / ED COURSE  I reviewed the triage vital signs and the nursing notes.                              Differential diagnosis includes, but is not limited to,   Patient's presentation is most consistent with acute presentation with potential threat to life or bodily function.  The patient is on the cardiac monitor to evaluate for evidence of arrhythmia and/or significant heart rate changes  57 year old female with history of metastatic esophageal cancer here with worsening epigastric pain.  Clinically, I suspect this pain is more so secondary to her worsening soft tissue mass in the area rather than symptoms from the hernia.  She has no evidence suggest obstruction or bowel involvement.  She is hemodynamically stable.  CT imaging shows worsening mass here as well as diffuse metastatic disease.  She has no evidence of obstruction.  CBC with mild leukocytosis with left shift the patient does have increased cough and history of recurrent pneumonia.  CT scan shows multifocal pneumonia.  CMP is largely unremarkable with normal renal function.  LFTs are normal.    FINAL CLINICAL IMPRESSION(S) / ED DIAGNOSES   Final diagnoses:  Cancer associated pain  Multifocal pneumonia     Rx / DC Orders   ED Discharge Orders     None        Note:  This document was prepared using Dragon voice recognition software and may include unintentional dictation errors.   Shaune Pollack, MD 02/15/23 [phone]

## 2023-02-16 DIAGNOSIS — K2100 Gastro-esophageal reflux disease with esophagitis, without bleeding: Secondary | ICD-10-CM | POA: Diagnosis present

## 2023-02-16 DIAGNOSIS — J9621 Acute and chronic respiratory failure with hypoxia: Secondary | ICD-10-CM | POA: Diagnosis present

## 2023-02-16 DIAGNOSIS — E871 Hypo-osmolality and hyponatremia: Secondary | ICD-10-CM | POA: Diagnosis present

## 2023-02-16 DIAGNOSIS — K219 Gastro-esophageal reflux disease without esophagitis: Secondary | ICD-10-CM | POA: Diagnosis present

## 2023-02-16 DIAGNOSIS — J9611 Chronic respiratory failure with hypoxia: Secondary | ICD-10-CM | POA: Diagnosis not present

## 2023-02-16 DIAGNOSIS — R1013 Epigastric pain: Secondary | ICD-10-CM | POA: Diagnosis not present

## 2023-02-16 DIAGNOSIS — C159 Malignant neoplasm of esophagus, unspecified: Secondary | ICD-10-CM | POA: Diagnosis present

## 2023-02-16 DIAGNOSIS — Z8249 Family history of ischemic heart disease and other diseases of the circulatory system: Secondary | ICD-10-CM | POA: Diagnosis not present

## 2023-02-16 DIAGNOSIS — G4700 Insomnia, unspecified: Secondary | ICD-10-CM | POA: Diagnosis present

## 2023-02-16 DIAGNOSIS — G893 Neoplasm related pain (acute) (chronic): Secondary | ICD-10-CM | POA: Diagnosis present

## 2023-02-16 DIAGNOSIS — I10 Essential (primary) hypertension: Secondary | ICD-10-CM | POA: Diagnosis present

## 2023-02-16 DIAGNOSIS — M797 Fibromyalgia: Secondary | ICD-10-CM | POA: Diagnosis present

## 2023-02-16 DIAGNOSIS — R52 Pain, unspecified: Secondary | ICD-10-CM | POA: Diagnosis present

## 2023-02-16 DIAGNOSIS — A040 Enteropathogenic Escherichia coli infection: Secondary | ICD-10-CM | POA: Diagnosis present

## 2023-02-16 DIAGNOSIS — Z841 Family history of disorders of kidney and ureter: Secondary | ICD-10-CM | POA: Diagnosis not present

## 2023-02-16 DIAGNOSIS — E43 Unspecified severe protein-calorie malnutrition: Secondary | ICD-10-CM

## 2023-02-16 DIAGNOSIS — J449 Chronic obstructive pulmonary disease, unspecified: Secondary | ICD-10-CM | POA: Diagnosis present

## 2023-02-16 DIAGNOSIS — F17210 Nicotine dependence, cigarettes, uncomplicated: Secondary | ICD-10-CM | POA: Diagnosis present

## 2023-02-16 DIAGNOSIS — R64 Cachexia: Secondary | ICD-10-CM | POA: Diagnosis present

## 2023-02-16 DIAGNOSIS — D638 Anemia in other chronic diseases classified elsewhere: Secondary | ICD-10-CM | POA: Diagnosis present

## 2023-02-16 DIAGNOSIS — D6869 Other thrombophilia: Secondary | ICD-10-CM | POA: Diagnosis present

## 2023-02-16 DIAGNOSIS — C7989 Secondary malignant neoplasm of other specified sites: Secondary | ICD-10-CM | POA: Diagnosis present

## 2023-02-16 DIAGNOSIS — Z681 Body mass index (BMI) 19 or less, adult: Secondary | ICD-10-CM | POA: Diagnosis not present

## 2023-02-16 DIAGNOSIS — Z79899 Other long term (current) drug therapy: Secondary | ICD-10-CM | POA: Diagnosis not present

## 2023-02-16 DIAGNOSIS — G629 Polyneuropathy, unspecified: Secondary | ICD-10-CM | POA: Diagnosis present

## 2023-02-16 DIAGNOSIS — D509 Iron deficiency anemia, unspecified: Secondary | ICD-10-CM | POA: Diagnosis present

## 2023-02-16 DIAGNOSIS — D75838 Other thrombocytosis: Secondary | ICD-10-CM | POA: Diagnosis present

## 2023-02-16 LAB — URINALYSIS, ROUTINE W REFLEX MICROSCOPIC
Bilirubin Urine: NEGATIVE
Glucose, UA: NEGATIVE mg/dL
Hgb urine dipstick: NEGATIVE
Ketones, ur: 5 mg/dL — AB
Leukocytes,Ua: NEGATIVE
Nitrite: NEGATIVE
Protein, ur: NEGATIVE mg/dL
Specific Gravity, Urine: >1.046 — ABNORMAL HIGH (ref 1.005–1.030)
pH: 5 (ref 5.0–8.0)

## 2023-02-16 LAB — GASTROINTESTINAL PANEL BY PCR, STOOL (REPLACES STOOL CULTURE)

## 2023-02-16 LAB — CBC
HCT: 29.6 % — ABNORMAL LOW (ref 36.0–46.0)
Hemoglobin: 9.6 g/dL — ABNORMAL LOW (ref 12.0–15.0)
MCH: 23.9 pg — ABNORMAL LOW (ref 26.0–34.0)
MCHC: 32.4 g/dL (ref 30.0–36.0)
MCV: 73.8 fL — ABNORMAL LOW (ref 80.0–100.0)
Platelets: 483 10*3/uL — ABNORMAL HIGH (ref 150–400)
RBC: 4.01 MIL/uL (ref 3.87–5.11)
RDW: 14.7 % (ref 11.5–15.5)
WBC: 10.6 10*3/uL — ABNORMAL HIGH (ref 4.0–10.5)
nRBC: 0 % (ref 0.0–0.2)

## 2023-02-16 LAB — C DIFFICILE QUICK SCREEN W PCR REFLEX
C Diff antigen: NEGATIVE
C Diff interpretation: NOT DETECTED
C Diff toxin: NEGATIVE

## 2023-02-16 LAB — COMPREHENSIVE METABOLIC PANEL
ALT: 12 U/L (ref 0–44)
AST: 16 U/L (ref 15–41)
Albumin: 2.6 g/dL — ABNORMAL LOW (ref 3.5–5.0)
Alkaline Phosphatase: 52 U/L (ref 38–126)
Anion gap: 8 (ref 5–15)
BUN: 9 mg/dL (ref 6–20)
CO2: 25 mmol/L (ref 22–32)
Calcium: 8.3 mg/dL — ABNORMAL LOW (ref 8.9–10.3)
Chloride: 101 mmol/L (ref 98–111)
Creatinine, Ser: 0.59 mg/dL (ref 0.44–1.00)
GFR, Estimated: >60 mL/min (ref >60)
Glucose, Bld: 109 mg/dL — ABNORMAL HIGH (ref 70–99)
Potassium: 3.9 mmol/L (ref 3.5–5.1)
Sodium: 134 mmol/L — ABNORMAL LOW (ref 135–145)
Total Bilirubin: 0.7 mg/dL (ref 0.3–1.2)
Total Protein: 6.3 g/dL — ABNORMAL LOW (ref 6.5–8.1)

## 2023-02-16 LAB — MRSA NEXT GEN BY PCR, NASAL: MRSA by PCR Next Gen: NOT DETECTED

## 2023-02-16 MED ORDER — CHLORHEXIDINE GLUCONATE CLOTH 2 % EX PADS
6.0000 | MEDICATED_PAD | Freq: Every day | CUTANEOUS | Status: DC
  Administered 2023-02-17 – 2023-02-20 (×4): 6 via TOPICAL

## 2023-02-16 MED ORDER — ADULT MULTIVITAMIN W/MINERALS CH
1.0000 | ORAL_TABLET | Freq: Every day | ORAL | Status: DC
  Administered 2023-02-16 – 2023-02-20 (×5): 1 via ORAL
  Filled 2023-02-16 (×5): qty 1

## 2023-02-16 MED ORDER — PREGABALIN 75 MG PO CAPS
200.0000 mg | ORAL_CAPSULE | Freq: Three times a day (TID) | ORAL | Status: DC
  Administered 2023-02-16 – 2023-02-20 (×13): 200 mg via ORAL
  Filled 2023-02-16 (×13): qty 1

## 2023-02-16 MED ORDER — RIVAROXABAN 10 MG PO TABS
10.0000 mg | ORAL_TABLET | Freq: Every day | ORAL | Status: DC
  Administered 2023-02-16 – 2023-02-20 (×5): 10 mg via ORAL
  Filled 2023-02-16 (×5): qty 1

## 2023-02-16 MED ORDER — ENSURE ENLIVE PO LIQD
237.0000 mL | Freq: Three times a day (TID) | ORAL | Status: DC
  Administered 2023-02-16 – 2023-02-20 (×12): 237 mL via ORAL

## 2023-02-16 MED ORDER — LIDOCAINE 5 % EX PTCH
1.0000 | MEDICATED_PATCH | CUTANEOUS | Status: DC
  Administered 2023-02-16 – 2023-02-19 (×4): 1 via TRANSDERMAL
  Filled 2023-02-16 (×5): qty 1

## 2023-02-16 MED ORDER — PANTOPRAZOLE SODIUM 40 MG PO TBEC
80.0000 mg | DELAYED_RELEASE_TABLET | Freq: Every day | ORAL | Status: DC
  Administered 2023-02-16 – 2023-02-20 (×5): 80 mg via ORAL
  Filled 2023-02-16 (×5): qty 2

## 2023-02-16 NOTE — Assessment & Plan Note (Addendum)
She will have to follow-up at [facility].

## 2023-02-16 NOTE — Plan of Care (Signed)
Patient with type 7 stool this shift, enteric precautions started. Stool sample sent, pending results.    Problem: Education: Goal: Knowledge of General Education information will improve Description: Including pain rating scale, medication(s)/side effects and non-pharmacologic comfort measures Outcome: Progressing   Problem: Health Behavior/Discharge Planning: Goal: Ability to manage health-related needs will improve Outcome: Progressing   Problem: Clinical Measurements: Goal: Ability to maintain clinical measurements within normal limits will improve Outcome: Progressing Goal: Will remain free from infection Outcome: Progressing Goal: Diagnostic test results will improve Outcome: Progressing Goal: Respiratory complications will improve Outcome: Progressing Goal: Cardiovascular complication will be avoided Outcome: Progressing   Problem: Activity: Goal: Risk for activity intolerance will decrease Outcome: Progressing   Problem: Nutrition: Goal: Adequate nutrition will be maintained Outcome: Progressing   Problem: Coping: Goal: Level of anxiety will decrease Outcome: Progressing   Problem: Elimination: Goal: Will not experience complications related to bowel motility Outcome: Progressing Goal: Will not experience complications related to urinary retention Outcome: Progressing   Problem: Pain Managment: Goal: General experience of comfort will improve Outcome: Progressing   Problem: Safety: Goal: Ability to remain free from injury will improve Outcome: Progressing   Problem: Skin Integrity: Goal: Risk for impaired skin integrity will decrease Outcome: Progressing

## 2023-02-16 NOTE — Assessment & Plan Note (Addendum)
On Protonix 

## 2023-02-16 NOTE — Progress Notes (Addendum)
Dr Mariane Masters made aware of lab result stool PCR panel + Enteropathogenic E. Coli, acknowledged, no new orders, Dr Mariane Masters said no isolation needed for this pt

## 2023-02-16 NOTE — Progress Notes (Signed)
Pharmacy Antibiotic Note  Haley Hebert is a 57 y.o. female admitted on 02/15/2023 with abdominal pain. PMH is significant for COPD, chronic respiratory failure, esophageal cancer, and an upper abdominal hernia. CT of the abdomen showed multifocal pneumonia. Patient was admitted in July for sepsis secondary to CAP treated with ceftriaxone/azithromycin. She was discharged on 01/11/23 on a 5-day course of doxycycline with a prednisone taper. Pharmacy has been consulted for vancomycin and cefepime dosing for pneumonia. Patient is afebrile on room air with WBC down trending at 10.6 today.  Plan: Continue vancomycin 750 mg Q24H Goal AUC 400-550 Expected AUC = 474 Expected Cmin = 10.9 SCr 0.8, TBW (TBW < IBW), Vd 0.72 Continue cefepime 2 gm IV Q12H  Height: 5\' 2"  (157.5 cm) Weight: 44.9 kg (98 lb 15.8 oz) IBW/kg (Calculated) : 50.1  Temp (24hrs), Avg:98 F (36.7 C), Min:97.8 F (36.6 C), Max:98.4 F (36.9 C)  Recent Labs  Lab 02/15/23 1704 02/15/23 2101 02/16/23 0518  WBC 14.0* 11.8* 10.6*  CREATININE 0.55  --  0.59  LATICACIDVEN 0.7 0.9  --     Estimated Creatinine Clearance: 53.7 mL/min (by C-G formula based on SCr of 0.59 mg/dL).    No Known Allergies  Antimicrobials this admission: Vancomycin 8/14 >>  Cefepime 8/14 >>  Dose adjustments this admission:  Microbiology results: 8/14 BCx: NG < 12h 8/14 MRSA PCR: ordered  Thank you for allowing pharmacy to be a part of this patient's care.  Littie Deeds, PharmD PGY1 Pharmacy Resident 02/16/2023 9:33 AM

## 2023-02-16 NOTE — Assessment & Plan Note (Signed)
Vitals:   02/15/23 1606 02/15/23 1704 02/15/23 1730 02/15/23 1800  BP: 113/80 130/81 113/76 110/77   02/15/23 1830 02/15/23 1930 02/15/23 2044 02/15/23 2100  BP: 103/75 109/80 115/77 112/84   02/15/23 2218  BP: 107/79  Controlled. Old BP meds.

## 2023-02-16 NOTE — Assessment & Plan Note (Addendum)
  Suspected pneumonia Patient is afebrile, Afebrile with Tmax 24-hour 98.1, WBC 9.3, differential would be drug reaction but seems a little less likely in this presentation Currently on cefepime 2 g IV every 12 hours which we will switch to oral cefuroxime 500 mg twice daily tomorrow CT: Groundglass and fine nodular airspace opacities improved at bases but otherwise worsened throughout the lungs since the imaging of the chest 01/08/2023 with concerns of ongoing multifocal infection versus inflammation

## 2023-02-16 NOTE — Assessment & Plan Note (Addendum)
Malignant pain secondary to metastatic esophageal cancer Patient reports only responding to IV 2 mg as needed every 4 hours morphine is not responding to p.o. oxycodone, plan of care will be to continue IV morphine and convert to oral morphine equivalent with 24-hour usage, increasing given near full response to 3 mg as needed every 4 hours today IV Patient thinks this is secondary to a abdominal hernia, explained CT findings, suspect this is unrelated to the splenic infarctions given location but rather secondary to the worsening peritoneal carcinomatosis and metastases 02/15/2023 CT abdomen pelvis revealed findings consistent with worsening soft tissue metastases compared to previous CT on 04/06/2022 as well as concerns for peritoneal carcinomatosis Splenic infarctions also noted, Of Note as per CT: Other: No abdominal wall hernia or abnormality.  Abdominal Exam not concerning for acute intraabdominal event

## 2023-02-16 NOTE — Assessment & Plan Note (Signed)
SpO2: 95 % O2 Flow Rate (L/min): 2 L/min Stable.

## 2023-02-16 NOTE — Progress Notes (Signed)
Initial Nutrition Assessment  DOCUMENTATION CODES:   Underweight, Severe malnutrition in context of chronic illness  INTERVENTION:   -Ensure Enlive po TID, each supplement provides 350 kcal and 20 grams of protein -Magic cup TID with meals, each supplement provides 290 kcal and 9 grams of protein  -MVI with minerals daily   NUTRITION DIAGNOSIS:   Severe Malnutrition related to chronic illness (COPD, esophageal cancer) as evidenced by moderate fat depletion, severe fat depletion, moderate muscle depletion, severe muscle depletion, percent weight loss.  GOAL:   Patient will meet greater than or equal to 90% of their needs  MONITOR:   PO intake, Supplement acceptance  REASON FOR ASSESSMENT:   Malnutrition Screening Tool    ASSESSMENT:   Pt with medical history significant for COPD, chronic respiratory failure, esophageal cancer followed by [facility] and worsening abdominal pain has a new hernia and was recently placed in a abdominal binder and is supposed to follow-up with [facility] for [facility].  Patient states that the abdominal pain is constant on relieving unrelenting and has no connection to food or worsening symptoms with not eating or eating and no anginal type of symptoms.  Pt admitted with abdominal pain.   Reviewed I/O's: +1.3 L x 24 hours   Per MD notes, pt may require transfer to [facility] (where she is followed for esophageal cancer).   Spoke with pt at bedside, who was pleasant and in good spirits today. Pt reports that she is feeling a little bit better. Per pt, she has been experiencing constant abdominal pain which has worsened over the past month. Pt shares that her intake has worsened over the past month due to pain; eating does not exacerbate pain, but is constant. Pt was able to consumed 1/4 of a pancake without difficulty.   Pt shares her UBW is around 110-120#. She estimates she has had a 10# wt loss over the past month. Reviewed wt hx; pt has experienced 10% wt loss over  the past month, which is significant for time frame.   Discussed importance of good meal and supplement intake to promote healing. Pt amenable to Ensure, states she tries to drink at least once per day at home.   Medications reviewed.   Labs reviewed: Na: 134.   NUTRITION - FOCUSED PHYSICAL EXAM:  Flowsheet Row Most Recent Value  Orbital Region Moderate depletion  Upper Arm Region Severe depletion  Thoracic and Lumbar Region Severe depletion  Buccal Region Severe depletion  Temple Region Severe depletion  Clavicle Bone Region Severe depletion  Clavicle and Acromion Bone Region Severe depletion  Scapular Bone Region Severe depletion  Dorsal Hand Moderate depletion  Patellar Region Moderate depletion  Anterior Thigh Region Moderate depletion  Posterior Calf Region Moderate depletion  Edema (RD Assessment) None  Hair Reviewed  Eyes Reviewed  Mouth Reviewed  Skin Reviewed  Nails Reviewed       Diet Order:   Diet Order             Diet regular Room service appropriate? Yes; Fluid consistency: Thin  Diet effective now                   EDUCATION NEEDS:   Education needs have been addressed  Skin:  Skin Assessment: Reviewed RN Assessment  Last BM:  Unknown  Height:   Ht Readings from Last 1 Encounters:  02/15/23 5\' 2"  (1.575 m)    Weight:   Wt Readings from Last 1 Encounters:  02/16/23 44.9 kg  Ideal Body Weight:  50 kg  BMI:  Body mass index is 18.1 kg/m.  Estimated Nutritional Needs:   Kcal:  1800-2000  Protein:  100-115 grams  Fluid:  > 1.8 L    Levada Schilling, RD, LDN, CDCES Registered Dietitian II Certified Diabetes Care and Education Specialist Please refer to [facility] for RD and/or RD on-call/weekend/after hours pager

## 2023-02-16 NOTE — Progress Notes (Addendum)
Progress Note   Patient: Haley Hebert UXL:[id number] DOB: 1965/07/19 DOA: 02/15/2023     0 DOS: the patient was seen and examined on 02/16/2023   Brief hospital course: Cachectic patient with esophageal cancer coming in with abdominal pain CT showing lymphadenopathy splenic infarct pneumonia.  Awaiting her care plan from [facility]. Admission requested for pain management and pneumonia.   Assessment and Plan:  Abdominal pain likely secondary to ? Metastatic cancer.   Pain control with IV morphine 2 mg PRN.  She is waiting on her procedure at [facility] or other TC facility.  Pt is exquisitely tender to touch.  Reviewed CT abd and pelvis with contrast. Status post pull-through esophagectomy. When compared to most recent imaging of the abdomen pelvis dated 04/06/2022, interval enlargement of a hypodense soft tissue mass in the vicinity of the gastroesophageal junction, abutting the gastric pull-through, the celiac axis, and the superior pancreatic head, measuring 4.4 x 3.4 cm, previously 3.3 x 2.9 cm. This is consistent with a worsening soft tissue metastasis. Similar enlargement of matted appearing left retroperitoneal lymph nodes which remain concerning for nodal metastatic disease.  Acute Diarrhea:  CT abdomen and pelvis as above Lipase 38 Obtain Stool studies and C diff testing If negative, give Imodium prn to slow down motility  Acute on chronic respiratory failure with hypoxia: Stable. Secondary to below and CAP (community acquired pneumonia):  CT imaging 02/15/23 showed: Very extensive ground-glass and fine nodular airspace opacity throughout the lungs, which is improved in the lung bases when compared to most recent imaging of the chest dated 01/08/2023, but significantly worsened elsewhere throughout the lungs. Consolidative airspace opacity and bronchiolar plugging previously seen in the lung bases is improved. Findings are consistent with fluctuant, ongoing multifocal infection or  inflammation. In the setting of chemotherapy or immunotherapy, drug toxicities a significant differential consideration for ground-glass airspace disease. Continue O2 therapy: 2 L/min Continue vancomycin and cefepime per pharmacy protocol.  Bedside swallow and aspiration and fall precautions.  Esophageal cancer (HCC) Status post pull-through esophagectomy.  Reviewed CT abd and pelvis with contrast from 02/15/23. Status post pull-through esophagectomy. When compared to most recent imaging of the abdomen pelvis dated 04/06/2022, interval enlargement of a hypodense soft tissue mass in the vicinity of the gastroesophageal junction, abutting the gastric pull-through, the celiac axis, and the superior pancreatic head, measuring 4.4 x 3.4 cm, previously 3.3 x 2.9 cm. This is consistent with a worsening soft tissue metastasis. Similar enlargement of matted appearing left retroperitoneal lymph nodes which remain concerning for nodal metastatic disease. F/U with oncology at [facility].  Admitted for pain control May need to be transferred.   GERD: stable, continue IV PPI.    Anemia, unspecified: Hb 9.6 this Am relatively stable, F/u CBC  Hypertension: Controlled      Subjective: Complaining of abdominal pain, 7/10 severity Denies nausea, vomiting, fever, chills, shortness of breath, cough, chest pain, constipation/diarrhea Denies headache/dizziness.   Physical Exam: Vitals:   02/15/23 2158 02/15/23 2218 02/16/23 0448 02/16/23 0500  BP:  107/79 99/65   Pulse:  83 (!) 59   Resp:  18 19   Temp:  98 F (36.7 C) 97.8 F (36.6 C)   TempSrc:   Oral   SpO2: 99% 95% 95%   Weight:    44.9 kg  Height:       Vitals and nursing note reviewed.  Constitutional:      General: She is not in acute distress.    Appearance: She is cachectic.  She is ill-appearing.  HENT:     Head: Normocephalic and atraumatic.     Right Ear: Hearing normal.     Left Ear: Hearing normal.     Nose: Nose normal. No nasal  deformity.     Mouth/Throat:     Lips: Pink.     Tongue: No lesions.     Pharynx: Oropharynx is clear.  Eyes:     General: Lids are normal.     Extraocular Movements: Extraocular movements intact.  Cardiovascular:     Rate and Rhythm: Normal rate and regular rhythm.     Heart sounds: Normal heart sounds.  Pulmonary:     Effort: Pulmonary effort is normal.     Breath sounds: Minimal rhonchi Abdominal:     General: Bowel sounds are normal. There is no distension.     Palpations: Abdomen is soft. There is no mass.     Tenderness: There is diffuse abdominal tenderness. There is guarding.  Musculoskeletal:     Right lower leg: No edema.     Left lower leg: No edema.  Skin:    General: Skin is warm.  Neurological:     General: No focal deficit present.     Mental Status: She is alert and oriented to person, place, and time.     Cranial Nerves: Cranial nerves 2-12 are intact.  Psychiatric:        Attention and Perception: Attention normal.          Speech: Speech normal.        Behavior: Appears ill with pain  Data Reviewed: Reviewed CT abd and pelvis with contrast from 02/15/23. Status post pull-through esophagectomy. When compared to most recent imaging of the abdomen pelvis dated 04/06/2022, interval enlargement of a hypodense soft tissue mass in the vicinity of the gastroesophageal junction, abutting the gastric pull-through, the celiac axis, and the superior pancreatic head, measuring 4.4 x 3.4 cm, previously 3.3 x 2.9 cm. This is consistent with a worsening soft tissue metastasis. Similar enlargement of matted appearing left retroperitoneal lymph nodes which remain concerning for nodal metastatic disease. Very extensive ground-glass and fine nodular airspace opacity throughout the lungs, which is improved in the lung bases when compared to most recent imaging of the chest dated 01/08/2023, but significantly worsened elsewhere throughout the lungs. Consolidative airspace opacity and  bronchiolar plugging previously seen in the lung bases is improved. Findings are consistent with fluctuant, ongoing multifocal infection or inflammation. In the setting of chemotherapy or immunotherapy, drug toxicities a significant differential consideration for ground-glass airspace disease.  Family Communication: none  Disposition: Status is: Observation   Planned Discharge Destination: TBD    Time spent: 35 minutes  Author: Ernestene Mention, MD 02/16/2023 8:18 AM  For on call review [email or web address].

## 2023-02-17 LAB — BASIC METABOLIC PANEL
Anion gap: 7 (ref 5–15)
BUN: 8 mg/dL (ref 6–20)
CO2: 24 mmol/L (ref 22–32)
Calcium: 8.2 mg/dL — ABNORMAL LOW (ref 8.9–10.3)
Chloride: 104 mmol/L (ref 98–111)
Creatinine, Ser: 0.59 mg/dL (ref 0.44–1.00)
GFR, Estimated: >60 mL/min (ref >60)
Glucose, Bld: 103 mg/dL — ABNORMAL HIGH (ref 70–99)
Potassium: 3.8 mmol/L (ref 3.5–5.1)
Sodium: 135 mmol/L (ref 135–145)

## 2023-02-17 LAB — CBC
HCT: 28.4 % — ABNORMAL LOW (ref 36.0–46.0)
Hemoglobin: 9.1 g/dL — ABNORMAL LOW (ref 12.0–15.0)
MCH: 23.9 pg — ABNORMAL LOW (ref 26.0–34.0)
MCHC: 32 g/dL (ref 30.0–36.0)
MCV: 74.7 fL — ABNORMAL LOW (ref 80.0–100.0)
Platelets: 508 10*3/uL — ABNORMAL HIGH (ref 150–400)
RBC: 3.8 MIL/uL — ABNORMAL LOW (ref 3.87–5.11)
RDW: 15.1 % (ref 11.5–15.5)
WBC: 6.9 10*3/uL (ref 4.0–10.5)
nRBC: 0 % (ref 0.0–0.2)

## 2023-02-17 LAB — MAGNESIUM: Magnesium: 1.9 mg/dL (ref 1.7–2.4)

## 2023-02-17 LAB — PHOSPHORUS: Phosphorus: 3.3 mg/dL (ref 2.5–4.6)

## 2023-02-17 NOTE — Progress Notes (Signed)
Pharmacy Antibiotic Note  Haley Hebert is a 57 y.o. female admitted on 02/15/2023 with abdominal pain. PMH is significant for COPD, chronic respiratory failure, esophageal cancer, and an upper abdominal hernia. CT of the abdomen showed multifocal pneumonia. Patient was admitted in July for sepsis secondary to CAP treated with ceftriaxone/azithromycin. She was discharged on 01/11/23 on a 5-day course of doxycycline with a prednisone taper. Pharmacy has been consulted for vancomycin and cefepime dosing for pneumonia. Patient is afebrile on room air with WBC down trending at 10.6 today.  Day 3 of antibiotics.  Plan: Continue cefepime 2 gm IV Q12H  Height: 5\' 2"  (157.5 cm) Weight: 43.7 kg (96 lb 5.5 oz) IBW/kg (Calculated) : 50.1  Temp (24hrs), Avg:97.9 F (36.6 C), Min:97.5 F (36.4 C), Max:98.2 F (36.8 C)  Recent Labs  Lab 02/15/23 1704 02/15/23 2101 02/16/23 0518  WBC 14.0* 11.8* 10.6*  CREATININE 0.55  --  0.59  LATICACIDVEN 0.7 0.9  --     Estimated Creatinine Clearance: 52.2 mL/min (by C-G formula based on SCr of 0.59 mg/dL).    No Known Allergies  Antimicrobials this admission: Vancomycin 8/14 >> 8/15(MRSA PCR was negative) Cefepime 8/14 >>  Dose adjustments this admission:  Microbiology results: 8/14 BCx: NGTD x 2 days 8/14 MRSA PCR: negative  Thank you for allowing pharmacy to be a part of this patient's care.  Bettey Costa, PharmD Clinical Pharmacist 02/17/2023 8:17 AM

## 2023-02-17 NOTE — Progress Notes (Addendum)
Progress Note   Patient: Haley Hebert [id number] DOB: 04/14/1966 DOA: 02/15/2023     1 DOS: the patient was seen and examined on 02/17/2023   Brief hospital course: Cachectic patient with esophageal cancer coming in with abdominal pain CT showing lymphadenopathy splenic infarct pneumonia.  Awaiting her care plan from [facility]. Admission requested for pain management and pneumonia.   Assessment and Plan:  Abdominal pain likely secondary to ? Metastatic cancer.   Pain control with IV morphine 2 mg PRN.  She is waiting on her procedure at [facility] or other TC facility.  Pt is exquisitely tender to touch.  Reviewed CT abd and pelvis with contrast. Status post pull-through esophagectomy. When compared to most recent imaging of the abdomen pelvis dated 04/06/2022, interval enlargement of a hypodense soft tissue mass in the vicinity of the gastroesophageal junction, abutting the gastric pull-through, the celiac axis, and the superior pancreatic head, measuring 4.4 x 3.4 cm, previously 3.3 x 2.9 cm. This is consistent with a worsening soft tissue metastasis. Similar enlargement of matted appearing left retroperitoneal lymph nodes which remain concerning for nodal metastatic disease.  Acute Diarrhea with Enteropathogenic E. Coli infection: WBC improving CT abdomen and pelvis as above Lipase 38 Stool studies: Stool PCR panel is positive for Enteropathogenic E. Coli Continue IV Cefepime for now C diff testing is negative F/u CBC, BMP, Mg   Acute on chronic respiratory failure with hypoxia: Stable. Secondary to below and CAP (community acquired pneumonia):  CT imaging 02/15/23 showed: Very extensive ground-glass and fine nodular airspace opacity throughout the lungs, which is improved in the lung bases when compared to most recent imaging of the chest dated 01/08/2023, but significantly worsened elsewhere throughout the lungs. Consolidative airspace opacity and bronchiolar plugging previously  seen in the lung bases is improved. Findings are consistent with fluctuant, ongoing multifocal infection or inflammation. In the setting of chemotherapy or immunotherapy, drug toxicities a significant differential consideration for ground-glass airspace disease. Continue O2 therapy: 2 L/min Continue vancomycin and cefepime per pharmacy protocol.  Bedside swallow and aspiration and fall precautions.  Esophageal cancer (HCC) Status post pull-through esophagectomy.  Reviewed CT abd and pelvis with contrast from 02/15/23. Status post pull-through esophagectomy. When compared to most recent imaging of the abdomen pelvis dated 04/06/2022, interval enlargement of a hypodense soft tissue mass in the vicinity of the gastroesophageal junction, abutting the gastric pull-through, the celiac axis, and the superior pancreatic head, measuring 4.4 x 3.4 cm, previously 3.3 x 2.9 cm. This is consistent with a worsening soft tissue metastasis. Similar enlargement of matted appearing left retroperitoneal lymph nodes which remain concerning for nodal metastatic disease. F/U with oncology at [facility].  Admitted for pain control May need to be transferred.   GERD: stable, continue IV PPI.    Anemia, unspecified: Hb 9.1 relatively stable, F/u CBC  Hypertension: Controlled      Subjective: Complaining of abdominal pain, 7/10 severity Denies nausea, vomiting, fever, chills, shortness of breath, cough, chest pain, constipation/diarrhea Denies headache/dizziness.   Physical Exam: Vitals:   02/16/23 2021 02/17/23 0500 02/17/23 0515 02/17/23 0806  BP: 108/72  109/65 113/78  Pulse: 66  62 63  Resp: 20  20 20   Temp: 97.7 F (36.5 C)  98.2 F (36.8 C) 98.1 F (36.7 C)  TempSrc: Oral  Oral Oral  SpO2: 96%  94% 94%  Weight:  43.7 kg    Height:       Vitals and nursing note reviewed.  Constitutional:  General: She is not in acute distress.    Appearance: She is cachectic. She is ill-appearing.  HENT:      Head: Normocephalic and atraumatic.     Right Ear: Hearing normal.     Left Ear: Hearing normal.     Nose: Nose normal. No nasal deformity.     Mouth/Throat:     Lips: Pink.     Tongue: No lesions.     Pharynx: Oropharynx is clear.  Eyes:     General: Lids are normal.     Extraocular Movements: Extraocular movements intact.  Cardiovascular:     Rate and Rhythm: Normal rate and regular rhythm.     Heart sounds: Normal heart sounds.  Pulmonary:     Effort: Pulmonary effort is normal.     Breath sounds: Minimal rhonchi Abdominal:     General: Bowel sounds are normal. There is no distension.     Palpations: Abdomen is soft. There is no mass.     Tenderness: There is diffuse abdominal tenderness.  Musculoskeletal:     Right lower leg: No edema.     Left lower leg: No edema.  Skin:    General: Skin is warm.  Neurological:     General: No focal deficit present.     Mental Status: She is alert and oriented to person, place, and time.     Cranial Nerves: Cranial nerves 2-12 are intact.  Psychiatric:        Attention and Perception: Attention normal.          Speech: Speech normal.        Behavior: Appears ill with pain  Data Reviewed: Reviewed CT abd and pelvis with contrast from 02/15/23. Status post pull-through esophagectomy. When compared to most recent imaging of the abdomen pelvis dated 04/06/2022, interval enlargement of a hypodense soft tissue mass in the vicinity of the gastroesophageal junction, abutting the gastric pull-through, the celiac axis, and the superior pancreatic head, measuring 4.4 x 3.4 cm, previously 3.3 x 2.9 cm. This is consistent with a worsening soft tissue metastasis. Similar enlargement of matted appearing left retroperitoneal lymph nodes which remain concerning for nodal metastatic disease. Very extensive ground-glass and fine nodular airspace opacity throughout the lungs, which is improved in the lung bases when compared to most recent imaging of the chest  dated 01/08/2023, but significantly worsened elsewhere throughout the lungs. Consolidative airspace opacity and bronchiolar plugging previously seen in the lung bases is improved. Findings are consistent with fluctuant, ongoing multifocal infection or inflammation. In the setting of chemotherapy or immunotherapy, drug toxicities a significant differential consideration for ground-glass airspace disease.    Latest Reference Range & Units 02/17/23 12:05  BASIC METABOLIC PANEL  Rpt !  Sodium 135 - 145 mmol/L 135  Potassium 3.5 - 5.1 mmol/L 3.8  Chloride 98 - 111 mmol/L 104  CO2 22 - 32 mmol/L 24  Glucose 70 - 99 mg/dL 098 (H)  BUN 6 - 20 mg/dL 8  Creatinine 1.19 - 1.47 mg/dL 8.29  Calcium 8.9 - 56.2 mg/dL 8.2 (L)  Anion gap 5 - 15  7  Phosphorus 2.5 - 4.6 mg/dL 3.3  Magnesium 1.7 - 2.4 mg/dL 1.9  GFR, Estimated >13 mL/min >60  WBC 4.0 - 10.5 K/uL 6.9  RBC 3.87 - 5.11 MIL/uL 3.80 (L)  Hemoglobin 12.0 - 15.0 g/dL 9.1 (L)  HCT 08.6 - 57.8 % 28.4 (L)  MCV 80.0 - 100.0 fL 74.7 (L)  MCH 26.0 - 34.0 pg 23.9 (L)  MCHC 30.0 - 36.0 g/dL 64.4  RDW 03.4 - 74.2 % 15.1  Platelets 150 - 400 K/uL 508 (H)  nRBC 0.0 - 0.2 % 0.0  !: Data is abnormal (H): Data is abnormally high (L): Data is abnormally low Rpt: View report in Results Review for more information  Family Communication: none  Disposition: Status is: inpt status   Planned Discharge Destination: 02/18/23 to home    Time spent: 35 minutes  Author: Ernestene Mention, MD 02/17/2023 11:49 AM  For on call review [email or web address].

## 2023-02-17 NOTE — Plan of Care (Signed)
  Problem: Clinical Measurements: Goal: Will remain free from infection Outcome: Progressing Goal: Diagnostic test results will improve Outcome: Progressing Goal: Respiratory complications will improve Outcome: Progressing Goal: Cardiovascular complication will be avoided Outcome: Progressing   Problem: Nutrition: Goal: Adequate nutrition will be maintained Outcome: Progressing   Problem: Coping: Goal: Level of anxiety will decrease Outcome: Progressing

## 2023-02-17 NOTE — Plan of Care (Signed)

## 2023-02-17 NOTE — Plan of Care (Signed)
  Problem: Education: Goal: Knowledge of General Education information will improve Description: Including pain rating scale, medication(s)/side effects and non-pharmacologic comfort measures Outcome: Progressing   Problem: Clinical Measurements: Goal: Respiratory complications will improve Outcome: Progressing   Problem: Activity: Goal: Risk for activity intolerance will decrease Outcome: Progressing   Problem: Coping: Goal: Level of anxiety will decrease Outcome: Progressing   Problem: Pain Managment: Goal: General experience of comfort will improve Outcome: Progressing   Problem: Safety: Goal: Ability to remain free from injury will improve Outcome: Progressing   Problem: Skin Integrity: Goal: Risk for impaired skin integrity will decrease Outcome: Progressing

## 2023-02-18 DIAGNOSIS — A044 Other intestinal Escherichia coli infections: Secondary | ICD-10-CM | POA: Insufficient documentation

## 2023-02-18 DIAGNOSIS — G629 Polyneuropathy, unspecified: Secondary | ICD-10-CM

## 2023-02-18 DIAGNOSIS — J9611 Chronic respiratory failure with hypoxia: Secondary | ICD-10-CM | POA: Insufficient documentation

## 2023-02-18 DIAGNOSIS — E871 Hypo-osmolality and hyponatremia: Secondary | ICD-10-CM | POA: Insufficient documentation

## 2023-02-18 DIAGNOSIS — D75839 Thrombocytosis, unspecified: Secondary | ICD-10-CM | POA: Insufficient documentation

## 2023-02-18 DIAGNOSIS — G893 Neoplasm related pain (acute) (chronic): Secondary | ICD-10-CM

## 2023-02-18 DIAGNOSIS — D6869 Other thrombophilia: Secondary | ICD-10-CM | POA: Insufficient documentation

## 2023-02-18 DIAGNOSIS — G4700 Insomnia, unspecified: Secondary | ICD-10-CM | POA: Insufficient documentation

## 2023-02-18 LAB — CBC
HCT: 28.6 % — ABNORMAL LOW (ref 36.0–46.0)
Hemoglobin: 9.5 g/dL — ABNORMAL LOW (ref 12.0–15.0)
MCH: 24.1 pg — ABNORMAL LOW (ref 26.0–34.0)
MCHC: 33.2 g/dL (ref 30.0–36.0)
MCV: 72.6 fL — ABNORMAL LOW (ref 80.0–100.0)
Platelets: 519 10*3/uL — ABNORMAL HIGH (ref 150–400)
RBC: 3.94 MIL/uL (ref 3.87–5.11)
RDW: 15.1 % (ref 11.5–15.5)
WBC: 9.3 10*3/uL (ref 4.0–10.5)
nRBC: 0 % (ref 0.0–0.2)

## 2023-02-18 LAB — BASIC METABOLIC PANEL
Anion gap: 5 (ref 5–15)
BUN: 9 mg/dL (ref 6–20)
CO2: 26 mmol/L (ref 22–32)
Calcium: 8.4 mg/dL — ABNORMAL LOW (ref 8.9–10.3)
Chloride: 106 mmol/L (ref 98–111)
Creatinine, Ser: 0.54 mg/dL (ref 0.44–1.00)
GFR, Estimated: >60 mL/min (ref >60)
Glucose, Bld: 109 mg/dL — ABNORMAL HIGH (ref 70–99)
Potassium: 3.8 mmol/L (ref 3.5–5.1)
Sodium: 137 mmol/L (ref 135–145)

## 2023-02-18 MED ORDER — CEFUROXIME AXETIL 500 MG PO TABS
500.0000 mg | ORAL_TABLET | Freq: Two times a day (BID) | ORAL | Status: DC
  Administered 2023-02-19: 500 mg via ORAL
  Filled 2023-02-18 (×2): qty 1

## 2023-02-18 MED ORDER — MORPHINE SULFATE (PF) 4 MG/ML IV SOLN
3.0000 mg | INTRAVENOUS | Status: DC | PRN
  Administered 2023-02-18 – 2023-02-19 (×6): 3 mg via INTRAVENOUS
  Filled 2023-02-18 (×6): qty 1

## 2023-02-18 NOTE — Assessment & Plan Note (Signed)
Reactive thrombocytosis Platelets currently 519 Will recheck in the a.m.

## 2023-02-18 NOTE — Progress Notes (Signed)
Pt with increasing mid-lower abdominal pain requiring Morphine and Vicodin this shift. Whereas Vicodin controlled pt's pain on Thursday night . Pt with intermittent rest periods. VSS.

## 2023-02-18 NOTE — Assessment & Plan Note (Signed)
Continue inhalers

## 2023-02-18 NOTE — Assessment & Plan Note (Signed)
  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

## 2023-02-18 NOTE — Assessment & Plan Note (Signed)
Resolved

## 2023-02-18 NOTE — Assessment & Plan Note (Signed)
  Neuropathy This is malignancy treatment related sequelae Lyrica 200 mg 3 times daily

## 2023-02-18 NOTE — Assessment & Plan Note (Signed)
Enteropathogenic E coli (EPEC) Diarrhea and GI symptoms have resolved currently on cefepime Plan on discontinuing cefepime after today Diagnosed via GI Panel

## 2023-02-18 NOTE — Progress Notes (Signed)
Mobility Specialist - Progress Note   02/18/23 1354  Mobility  Activity Ambulated independently in hallway;Stood at bedside;Dangled on edge of bed  Level of Assistance Independent  Assistive Device None  Distance Ambulated (ft) 180 ft  Activity Response Tolerated well  Mobility Referral Yes  $Mobility charge 1 Mobility  Mobility Specialist Start Time (ACUTE ONLY) 1342  Mobility Specialist Stop Time (ACUTE ONLY) 1351  Mobility Specialist Time Calculation (min) (ACUTE ONLY) 9 min   Pt supine in bed on RA upon arrival. Pt STS and ambulates in hallway indep. Pt returns to bed with needs in reach.  Terrilyn Saver  Mobility Specialist  02/18/23 1:55 PM

## 2023-02-18 NOTE — Assessment & Plan Note (Signed)
  Chronic respiratory failure As per discharge summary 01/11/2023 the patient is on chronic 2 L maintaining intermittently at rest off at the hospital Needs at night and ambulation as per documentation Patient does not have an escalation of oxygen requirement this admission

## 2023-02-18 NOTE — Assessment & Plan Note (Signed)
Olanzapine 5 mg at night, trazodone 300 mg at night

## 2023-02-18 NOTE — Progress Notes (Signed)
Progress Note   Patient: Haley Hebert [id number]:[id number] DOB: 13-Mar-1966 DOA: 02/15/2023     2 DOS: the patient was seen and examined on 02/18/2023   Brief hospital course: 57 year old female with a past medical history of esophageal cancer presented with epigastric abdominal pain, subsequent CT of the abdomen revealed , worsening soft tissue metastases compared to previous CT on 04/06/2022 as well as concerns for peritoneal carcinomatosis, pneumonia and splenic infarction.  Patient is requiring ongoing parenteral control for pain.  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 oxycodone, plan of care will be to continue IV morphine and convert to oral morphine equivalent with 24-hour usage, increasing given near full response to 3 mg as needed every 4 hours today IV Patient thinks this is secondary to a abdominal hernia, explained CT findings, suspect this is unrelated to the splenic infarctions given location but rather secondary to the worsening peritoneal carcinomatosis and metastases 02/15/2023 CT abdomen pelvis revealed findings consistent with worsening soft tissue metastases compared to previous CT on 04/06/2022 as well as concerns for peritoneal carcinomatosis Splenic infarctions also noted, Of Note as per CT: Other: No abdominal wall hernia or abnormality.  Abdominal Exam not concerning for acute intraabdominal event  COPD without exacerbation (HCC) COPD Anoro Ellipta 1 puff daily to continue  Esophageal cancer (HCC) Esophageal cancer Underwent shared decision making with this patient and goal is to get to p.o. 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 as well as malignancy Continue Xarelto 10 mg daily for DVT prophylaxis  Neuropathy  Neuropathy This is malignancy treatment related sequelae Lyrica 200 mg 3 times daily  Insomnia Insomnia Olanzapine 5 mg at night, trazodone 300 mg at night  Chronic respiratory failure with hypoxia (HCC)  Chronic respiratory failure As per discharge summary 01/11/2023 the patient is on chronic 2 L maintaining intermittently at rest off at the hospital Needs at night and ambulation as per documentation Patient does not have an escalation of oxygen requirement this admission   Thrombocytosis Reactive thrombocytosis Platelets currently 519 Will recheck in the a.m.  E. coli gastroenteritis Enteropathogenic E coli (EPEC) Diarrhea and GI symptoms have resolved currently on cefepime Plan on discontinuing cefepime after today Diagnosed via GI Panel  Hyponatremia Hyponatremia Resolved, sodium currently 137 Will recheck in the a.m.  Anemia, unspecified Anemia Stable, hemoglobin 9.5 from 9.1, microcytic in nature Likely anemia of chronic disease Stable currently no reports of any gross bleeding.   CAP (community acquired pneumonia)  Suspected pneumonia Patient is afebrile, Afebrile with Tmax 24-hour 98.1, WBC 9.3, differential would be drug reaction but seems a little less likely in this presentation Currently on cefepime 2 g IV every 12 hours which we will switch to oral cefuroxime 500 mg twice daily tomorrow CT: Groundglass and fine nodular airspace opacities improved at bases but otherwise worsened throughout the lungs since the imaging of the chest 01/08/2023 with concerns of ongoing multifocal infection versus inflammation           Subjective: Last dose IV morphine at 6:48 AM today denies any fever or chills Reports almost no pain  relief  with oral oxycodone Still epigastric pain but no n/v  Physical Exam: Vitals:   02/17/23 2027 02/18/23 0435 02/18/23 0500 02/18/23 0935  BP: 114/74 124/80  (!) 153/81  Pulse: 60 (!) 59  72  Resp: 12 12  20   Temp: 98.1 F (36.7 C) (!) 97.5 F (36.4 C)  97.8 F (36.6 C)  TempSrc:    Oral  SpO2: 94% 94%  95%  Weight:   44.3 kg   Height:      Constitutional:  Vital Signs as per Above [facility] than three noted] No Acute Distress Eyes:  Pink Conjunctiva and no Ptosis Respiratory:   Respiratory Effort Normal: No Use of Respiratory Muscles,No  Intercostal Retractions             Lungs Scattered Crackles Cardiovascular:   Heart Auscultated: Regular Regular without any added sounds or murmurs              No Lower Extremity Edema Gastrointestinal:  Patient had abdominal binder on removed for examination, patient of note has a right lower quadrant tattoo Patient has exquisite epigastric pain to palpation, no rebound or guarding  Data Reviewed: CT Abdomen and Labs as per A/P  Family Communication: N/A  Disposition: Status is: Inpatient Requires ongoing parenteral control of pain  Planned Discharge Destination: Home  Author: Princess Bruins, MD 02/18/2023 2:12 PM  For on call review [email or web address].

## 2023-02-19 DIAGNOSIS — G4709 Other insomnia: Secondary | ICD-10-CM

## 2023-02-19 DIAGNOSIS — J9611 Chronic respiratory failure with hypoxia: Secondary | ICD-10-CM | POA: Diagnosis not present

## 2023-02-19 DIAGNOSIS — D509 Iron deficiency anemia, unspecified: Secondary | ICD-10-CM | POA: Insufficient documentation

## 2023-02-19 DIAGNOSIS — R1013 Epigastric pain: Secondary | ICD-10-CM

## 2023-02-19 DIAGNOSIS — J449 Chronic obstructive pulmonary disease, unspecified: Secondary | ICD-10-CM | POA: Diagnosis not present

## 2023-02-19 DIAGNOSIS — E871 Hypo-osmolality and hyponatremia: Secondary | ICD-10-CM

## 2023-02-19 DIAGNOSIS — A040 Enteropathogenic Escherichia coli infection: Secondary | ICD-10-CM

## 2023-02-19 DIAGNOSIS — D508 Other iron deficiency anemias: Secondary | ICD-10-CM

## 2023-02-19 DIAGNOSIS — E43 Unspecified severe protein-calorie malnutrition: Secondary | ICD-10-CM | POA: Insufficient documentation

## 2023-02-19 DIAGNOSIS — D75839 Thrombocytosis, unspecified: Secondary | ICD-10-CM

## 2023-02-19 LAB — BASIC METABOLIC PANEL
Anion gap: 5 (ref 5–15)
BUN: 11 mg/dL (ref 6–20)
CO2: 26 mmol/L (ref 22–32)
Calcium: 8.2 mg/dL — ABNORMAL LOW (ref 8.9–10.3)
Chloride: 104 mmol/L (ref 98–111)
Creatinine, Ser: 0.57 mg/dL (ref 0.44–1.00)
GFR, Estimated: >60 mL/min (ref >60)
Glucose, Bld: 93 mg/dL (ref 70–99)
Potassium: 3.9 mmol/L (ref 3.5–5.1)
Sodium: 135 mmol/L (ref 135–145)

## 2023-02-19 LAB — CBC
HCT: 28.2 % — ABNORMAL LOW (ref 36.0–46.0)
Hemoglobin: 9.1 g/dL — ABNORMAL LOW (ref 12.0–15.0)
MCH: 24.1 pg — ABNORMAL LOW (ref 26.0–34.0)
MCHC: 32.3 g/dL (ref 30.0–36.0)
MCV: 74.8 fL — ABNORMAL LOW (ref 80.0–100.0)
Platelets: 451 10*3/uL — ABNORMAL HIGH (ref 150–400)
RBC: 3.77 MIL/uL — ABNORMAL LOW (ref 3.87–5.11)
RDW: 15.4 % (ref 11.5–15.5)
WBC: 6.7 10*3/uL (ref 4.0–10.5)
nRBC: 0.3 % — ABNORMAL HIGH (ref 0.0–0.2)

## 2023-02-19 MED ORDER — MORPHINE SULFATE (PF) 2 MG/ML IV SOLN
1.0000 mg | INTRAVENOUS | Status: DC | PRN
  Administered 2023-02-19 – 2023-02-20 (×4): 1 mg via INTRAVENOUS
  Filled 2023-02-19 (×4): qty 1

## 2023-02-19 MED ORDER — OXYCODONE HCL ER 20 MG PO T12A
20.0000 mg | EXTENDED_RELEASE_TABLET | Freq: Two times a day (BID) | ORAL | Status: DC
  Administered 2023-02-19 (×2): 20 mg via ORAL
  Filled 2023-02-19 (×2): qty 1

## 2023-02-19 MED ORDER — OXYCODONE HCL 5 MG PO TABS
10.0000 mg | ORAL_TABLET | ORAL | Status: DC | PRN
  Administered 2023-02-19 – 2023-02-20 (×6): 10 mg via ORAL
  Filled 2023-02-19 (×6): qty 2

## 2023-02-19 MED ORDER — AZITHROMYCIN 500 MG PO TABS
500.0000 mg | ORAL_TABLET | Freq: Every day | ORAL | Status: DC
  Administered 2023-02-19 – 2023-02-20 (×2): 500 mg via ORAL
  Filled 2023-02-19 (×2): qty 1

## 2023-02-19 NOTE — Progress Notes (Signed)
  Progress Note   Patient: Haley Hebert ZOX:[id number] DOB: [birth year] DOA: 02/15/2023     3 DOS: the patient was seen and examined on 02/19/2023   Brief hospital course: 57 year old female past medical history of esophageal cancer presented with midepigastric abdominal pain.  CT scan showed worsening soft tissue metastases compared to previous CT scan.    8/18.  Patient asking for better pain control upon going home.  Asking for long-acting pain medication.  Will start long-acting pain medication today.  For enteropathogenic E. coli will switch antibiotic over to Zithromax for 3 days.  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  Started long-acting pain medication.  Will taper off IV morphine.  Continue short acting pain medications.  Intestinal infection due to enteropathogenic E. coli Change antibiotic to Zithromax.  COPD without exacerbation (HCC) Continue inhalers  Esophageal cancer (HCC) She will have to follow-up at [facility].  GERD (gastroesophageal reflux disease) On Protonix  Iron deficiency anemia Hemoglobin 9.1 and last ferritin 89.  Protein-calorie malnutrition, severe Continue supplements  Neuropathy Lyrica 200 mg 3 times daily  Insomnia Olanzapine 5 mg at night, trazodone 300 mg at night  Chronic respiratory failure with hypoxia (HCC) Chronically on 2 L  Thrombocytosis Reactive thrombocytosis  Hyponatremia Resolved  CAP (community acquired pneumonia) Treated on last hospitalization.         Subjective: Patient with worsening abdominal pain.  Asking for something better to control her pain.  Interested in long-acting pain medication.  Follows at [facility] for her history of cancer.  Physical Exam: Vitals:   02/18/23 2035 02/19/23 0500 02/19/23 0512 02/19/23 0805  BP: 106/62  94/64 101/66  Pulse: 60  (!) 58 (!) 59  Resp: 18  16 16   Temp:  98.4 F (36.9 C)  98.3 F (36.8 C) 98 F (36.7 C)  TempSrc: Oral  Oral Oral  SpO2: 93%  93% 93%  Weight:  46.2 kg    Height:       Physical Exam HENT:     Head: Normocephalic.     Mouth/Throat:     Pharynx: No oropharyngeal exudate.  Eyes:     General: Lids are normal.     Conjunctiva/sclera: Conjunctivae normal.  Cardiovascular:     Rate and Rhythm: Normal rate and regular rhythm.     Heart sounds: Normal heart sounds, S1 normal and S2 normal.  Pulmonary:     Breath sounds: No decreased breath sounds, wheezing, rhonchi or rales.  Abdominal:     Palpations: Abdomen is soft.     Tenderness: There is abdominal tenderness.  Musculoskeletal:     Right lower leg: No swelling.     Left lower leg: No swelling.  Skin:    General: Skin is warm.     Findings: No rash.  Neurological:     Mental Status: She is alert and oriented to person, place, and time.     Data Reviewed: Creatinine 0.57, white blood cell count 6.7, hemoglobin 9.1, platelet 451   Disposition: Status is: Inpatient Remains inpatient appropriate because: Starting long-acting pain medications as per patient request.  Starting today and reassess tomorrow.  Planned Discharge Destination: Home    Time spent: 28 minutes  Author: Alford Highland, MD 02/19/2023 3:08 PM  For on call review [email or web address].

## 2023-02-19 NOTE — Assessment & Plan Note (Signed)
Continue supplements

## 2023-02-19 NOTE — Assessment & Plan Note (Signed)
Changed antibiotic to Zithromax, one more day upon discharge.

## 2023-02-19 NOTE — Assessment & Plan Note (Signed)
Hemoglobin 9.1 and last ferritin 89.

## 2023-02-19 NOTE — Plan of Care (Signed)
  Problem: Education: Goal: Knowledge of General Education information will improve Description: Including pain rating scale, medication(s)/side effects and non-pharmacologic comfort measures Outcome: Progressing   Problem: Health Behavior/Discharge Planning: Goal: Ability to manage health-related needs will improve Outcome: Progressing   Problem: Clinical Measurements: Goal: Will remain free from infection Outcome: Progressing   Problem: Activity: Goal: Risk for activity intolerance will decrease Outcome: Progressing   Problem: Coping: Goal: Level of anxiety will decrease Outcome: Progressing   Problem: Elimination: Goal: Will not experience complications related to bowel motility Outcome: Progressing   Problem: Pain Managment: Goal: General experience of comfort will improve Outcome: Progressing   Problem: Skin Integrity: Goal: Risk for impaired skin integrity will decrease Outcome: Progressing

## 2023-02-20 ENCOUNTER — Other Ambulatory Visit: Payer: Self-pay

## 2023-02-20 ENCOUNTER — Other Ambulatory Visit (HOSPITAL_COMMUNITY): Payer: Self-pay

## 2023-02-20 DIAGNOSIS — J449 Chronic obstructive pulmonary disease, unspecified: Secondary | ICD-10-CM | POA: Diagnosis not present

## 2023-02-20 DIAGNOSIS — R1013 Epigastric pain: Secondary | ICD-10-CM | POA: Diagnosis not present

## 2023-02-20 DIAGNOSIS — C159 Malignant neoplasm of esophagus, unspecified: Secondary | ICD-10-CM | POA: Diagnosis not present

## 2023-02-20 DIAGNOSIS — A040 Enteropathogenic Escherichia coli infection: Secondary | ICD-10-CM | POA: Diagnosis not present

## 2023-02-20 LAB — CULTURE, BLOOD (SINGLE)
Culture: NO GROWTH
Special Requests: ADEQUATE

## 2023-02-20 MED ORDER — MORPHINE SULFATE ER 15 MG PO TBCR
15.0000 mg | EXTENDED_RELEASE_TABLET | Freq: Three times a day (TID) | ORAL | Status: DC
  Administered 2023-02-20 (×2): 15 mg via ORAL
  Filled 2023-02-20 (×2): qty 1

## 2023-02-20 MED ORDER — HEPARIN SOD (PORK) LOCK FLUSH 100 UNIT/ML IV SOLN
500.0000 [IU] | Freq: Once | INTRAVENOUS | Status: DC
  Filled 2023-02-20: qty 5

## 2023-02-20 MED ORDER — LACTULOSE 10 GM/15ML PO SOLN: 30.0000 g | Freq: Every day | ORAL | Status: DC | PRN

## 2023-02-20 MED ORDER — ENSURE ENLIVE PO LIQD
237.0000 mL | Freq: Three times a day (TID) | ORAL | 0 refills | Status: DC
  Filled 2023-02-20: qty 21330, 30d supply, fill #0

## 2023-02-20 MED ORDER — MORPHINE SULFATE ER 15 MG PO TBCR
15.0000 mg | EXTENDED_RELEASE_TABLET | Freq: Three times a day (TID) | ORAL | 0 refills | Status: AC
  Filled 2023-02-20: qty 15, 5d supply, fill #0

## 2023-02-20 MED ORDER — NALOXONE HCL 4 MG/0.1ML NA LIQD
1.0000 | Freq: Once | NASAL | 0 refills | Status: DC | PRN
  Filled 2023-02-20: qty 2, 1d supply, fill #0

## 2023-02-20 MED ORDER — POLYETHYLENE GLYCOL 3350 17 GM/SCOOP PO POWD
17.0000 g | Freq: Two times a day (BID) | ORAL | 0 refills | Status: DC
  Filled 2023-02-20: qty 510, 15d supply, fill #0

## 2023-02-20 MED ORDER — LACTULOSE 10 GM/15ML PO SOLN
30.0000 g | Freq: Every day | ORAL | 0 refills | Status: DC | PRN
  Filled 2023-02-20: qty 946, 21d supply, fill #0

## 2023-02-20 MED ORDER — ADULT MULTIVITAMIN W/MINERALS CH: 1.0000 | ORAL_TABLET | Freq: Every day | ORAL | Status: DC

## 2023-02-20 MED ORDER — MORPHINE SULFATE 15 MG PO TABS
15.0000 mg | ORAL_TABLET | ORAL | 0 refills | Status: AC | PRN
  Filled 2023-02-20: qty 30, 5d supply, fill #0

## 2023-02-20 MED ORDER — OXYCODONE HCL 5 MG PO TABS
15.0000 mg | ORAL_TABLET | ORAL | Status: DC | PRN
  Administered 2023-02-20: 15 mg via ORAL
  Filled 2023-02-20: qty 3

## 2023-02-20 MED ORDER — AZITHROMYCIN 500 MG PO TABS
500.0000 mg | ORAL_TABLET | Freq: Every day | ORAL | 0 refills | Status: DC
  Filled 2023-02-20: qty 1, 1d supply, fill #0

## 2023-02-20 MED ORDER — POLYETHYLENE GLYCOL 3350 17 G PO PACK
17.0000 g | PACK | Freq: Two times a day (BID) | ORAL | Status: DC
  Administered 2023-02-20: 17 g via ORAL
  Filled 2023-02-20: qty 1

## 2023-02-20 NOTE — TOC Benefit Eligibility Note (Addendum)
Patient Product/process development scientist completed.    The patient is insured through [facility] MEDICAID.     Ran test claim for oxycodone 10 mg and the current 5 day co-pay is $4.00.  Ran test claim for Oxycontin 20 mg and the current 5 day co-pay is $4.00.  Ran test claim for morphine 30 mg 12 hr tablet and the current 5 day co-pay is $4.00.  Ran test claim for morphine 15 mg and the current 5 day co-pay is $4.00.   This test claim was processed through [facility]- copay amounts may vary at other pharmacies due to pharmacy/plan contracts, or as the patient moves through the different stages of their insurance plan.     Roland Earl, CPHT Pharmacy Technician III Certified Patient Advocate [facility] Patient Advocate Team Direct Number: [phone]  Fax: [phone]

## 2023-02-20 NOTE — Discharge Summary (Signed)
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  CT scan showed worsening soft tissue metastases compared to previous CT scan.    8/18.  Patient asking for better pain control upon going home.  Asking for long-acting pain medication.  Will start long-acting pain medication today.  For enteropathogenic E. coli will switch antibiotic over to Zithromax for 3 days. 8/19.  Switched patient to MS contin 15mg  po tid and msir 15 mg po q 4-6 pn pain.  5 day supply of medications prescribed.  Spoke with [facility] and they are able to call patient husband to get in for appointment on Wednesday.  Patient wanted to go home in the afternoon.  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  Switched to MS Contin per patient request 15mg  po tid and MSIR 15 mg q4-6 hours prn pain.  I spoke with [facility] and they will call patient husband to get in for appointment on Wednesday.  Intestinal infection due  to enteropathogenic E. coli Changed antibiotic to Zithromax, one more day upon discharge.  COPD without exacerbation (HCC) Continue inhalers  Esophageal cancer (HCC) She will have to follow-up at [facility].  GERD (gastroesophageal reflux disease) On Protonix  Iron deficiency anemia Hemoglobin 9.1 and last ferritin 89.  Protein-calorie malnutrition, severe Continue supplements  Neuropathy Lyrica 200 mg 3 times daily  Insomnia Olanzapine 5 mg at night, trazodone 300 mg at night  Chronic respiratory failure with hypoxia (HCC) Chronically on 2 L  Thrombocytosis Reactive thrombocytosis  Hyponatremia Resolved  CAP (community acquired pneumonia) Treated on last hospitalization.           Consultants: none Procedures performed: none  Disposition: Home Diet recommendation:  Regular diet DISCHARGE MEDICATION: Allergies as of 02/20/2023   No Known Allergies      Medication List     STOP taking these medications    doxycycline 100 MG tablet Commonly known as: VIBRA-TABS   methocarbamol 500 MG tablet Commonly known as: ROBAXIN   oxyCODONE 5 MG/5ML solution Commonly known as: ROXICODONE       TAKE these medications    azithromycin 500 MG tablet Commonly known as: ZITHROMAX Take 1 tablet (500 mg total) by mouth daily. Start taking on: February 21, 2023   busPIRone 15 MG tablet Commonly known as: BUSPAR Take 15 mg by mouth 2 (two) times daily.   cetirizine 10 MG tablet Commonly known as: ZYRTEC Take 10 mg by mouth daily as needed for rhinitis or allergies.   Combivent Respimat 20-100 MCG/ACT Aers respimat Generic drug:  Ipratropium-Albuterol Inhale 1 puff into the lungs every 6 (six) hours as needed for shortness of breath or wheezing.   ipratropium-albuterol 0.5-2.5 (3) MG/3ML Soln Commonly known as: DUONEB Take 3 mLs by nebulization every 6 (six) hours as needed.   Compressor/Nebulizer Misc 1 each by Does not apply route every 6 (six) hours as  needed.   escitalopram 20 MG tablet Commonly known as: LEXAPRO Take 20 mg by mouth daily.   feeding supplement Liqd Take 237 mLs by mouth 3 (three) times daily between meals.   fluticasone 50 MCG/ACT nasal spray Commonly known as: FLONASE Place 1 spray into both nostrils daily as needed for rhinitis or allergies.   lactulose 10 GM/15ML solution Commonly known as: CHRONULAC Take 45 mLs (30 g total) by mouth daily as needed for severe constipation.   lidocaine 5 % Commonly known as: LIDODERM Place 1 patch onto the skin daily.   montelukast 10 MG tablet Commonly known as: SINGULAIR TAKE 1 TABLET BY MOUTH EVERY DAY AT NIGHT   morphine 15 MG tablet Commonly known as: MSIR Take 1 tablet (15 mg total) by mouth every 4 (four) hours as needed for up to 5 days for severe pain.   morphine 15 MG 12 hr tablet Commonly known as: MS CONTIN Take 1 tablet (15 mg total) by mouth 3 (three) times daily for 5 days.   multivitamin with minerals Tabs tablet Take 1 tablet by mouth daily.   naloxone 4 MG/0.1ML Liqd nasal spray kit Commonly known as: NARCAN Place 1 spray into the nose once as needed for up to 1 dose (opiod reversal). What changed:  when to take this reasons to take this   OLANZapine 5 MG tablet Commonly known as: ZYPREXA Take 1 tablet by mouth at bedtime.   omeprazole 40 MG capsule Commonly known as: PRILOSEC Take 40 mg by mouth 2 (two) times daily.   ondansetron 8 MG tablet Commonly known as: ZOFRAN Take 8 mg by mouth every 12 (twelve) hours as needed for refractory nausea / vomiting, vomiting or nausea.   polyethylene glycol powder 17 GM/SCOOP powder Commonly known as: GLYCOLAX/MIRALAX Take 17 g by mouth 2 (two) times daily.   potassium chloride 10 MEQ CR capsule Commonly known as: MICRO-K Take 10 mEq by mouth 2 (two) times daily.   pregabalin 200 MG capsule Commonly known as: LYRICA Take 200 mg by mouth 3 (three) times daily.   raloxifene 60 MG  tablet Commonly known as: EVISTA Take 60 mg by mouth daily.   traZODone 150 MG tablet Commonly known as: DESYREL TAKE 2 TABLETS (300 MG TOTAL) BY MOUTH NIGHTLY   umeclidinium-vilanterol 62.5-25 MCG/ACT Aepb Commonly known as: ANORO ELLIPTA Inhale 1 puff into the lungs daily.        Follow-up Information     Su Monks, PA Follow up in 5 day(s).   Specialty: Physician Assistant Contact information: [address] [place] [address] [phone]                Discharge Exam: Filed Weights   02/18/23 0500 02/19/23 0500 02/20/23 0500  Weight: 44.3 kg 46.2 kg 46.3 kg   Physical Exam HENT:     Head: Normocephalic.     Mouth/Throat:     Pharynx: No oropharyngeal exudate.  Eyes:     General: Lids are normal.     Conjunctiva/sclera: Conjunctivae normal.  Cardiovascular:     Rate and Rhythm: Normal rate and regular rhythm.     Heart sounds:  Normal heart sounds, S1 normal and S2 normal.  Pulmonary:     Breath sounds: No decreased breath sounds, wheezing, rhonchi or rales.  Abdominal:     Palpations: Abdomen is soft.     Tenderness: There is abdominal tenderness.  Musculoskeletal:     Right lower leg: No swelling.     Left lower leg: No swelling.  Skin:    General: Skin is warm.     Findings: No rash.  Neurological:     Mental Status: She is alert and oriented to person, place, and time.      Condition at discharge: fair  The results of significant diagnostics from this hospitalization (including imaging, microbiology, ancillary and laboratory) are listed below for reference.   Imaging Studies: CT CHEST ABDOMEN PELVIS W CONTRAST  Result Date: 02/15/2023 CLINICAL DATA:  Sepsis, history of esophageal cancer status post gastroesophageal pull-through * Tracking Code: BO * EXAM: CT CHEST, ABDOMEN, AND PELVIS WITH CONTRAST TECHNIQUE: Multidetector CT imaging of the chest, abdomen and pelvis was performed following the standard protocol during bolus  administration of intravenous contrast. RADIATION DOSE REDUCTION: This exam was performed according to the departmental dose-optimization program which includes automated exposure control, adjustment of the mA and/or kV according to patient size and/or use of iterative reconstruction technique. CONTRAST:  80mL OMNIPAQUE IOHEXOL 300 MG/ML  SOLN COMPARISON:  CT chest angiogram, 01/08/2023, CT abdomen pelvis GI bleed study, 04/06/2022 FINDINGS: CT CHEST FINDINGS Cardiovascular: Right chest port catheter. Scattered aortic atherosclerosis. Normal heart size. No pericardial effusion. Mediastinum/Nodes: Unchanged enlarged pretracheal lymph nodes measuring up to 1.7 x 1.2 cm (series 2, image 24). Status post pull-through esophagectomy. Gastric pull-through is fluid and debris-filled. Thyroid gland, trachea, and esophagus demonstrate no significant findings. Lungs/Pleura: Mild centrilobular and paraseptal emphysema. Diffuse bilateral bronchial wall thickening. Very extensive ground-glass and fine nodular airspace opacity throughout the lungs, which is improved in the lung bases but significantly worsened elsewhere throughout the lungs. Consolidative airspace opacity and bronchiolar plugging previously seen in the lung bases is improved. Musculoskeletal: No chest wall abnormality. No acute osseous findings. CT ABDOMEN PELVIS FINDINGS Hepatobiliary: No focal liver abnormality is seen. Status post cholecystectomy. Unchanged postoperative biliary ductal dilatation. Pancreas: Unremarkable. No pancreatic ductal dilatation or surrounding inflammatory changes. Spleen: Multiple new retracted, hypodense splenic infarctions (series 2, image 56). Adrenals/Urinary Tract: Adrenal glands are unremarkable. Kidneys are normal, without renal calculi, solid lesion, or hydronephrosis. Bladder is unremarkable. Stomach/Bowel: Status post pull-through esophagectomy. Interval enlargement of a hypodense soft tissue mass in the vicinity of the  gastroesophageal junction, abutting the gastric pull-through, the celiac axis, and the superior pancreatic head, measuring 4.4 x 3.4 cm, previously 3.3 x 2.9 cm (series 2, image 51). Appendix not clearly visualized. No evidence of bowel wall thickening, distention, or inflammatory changes. Vascular/Lymphatic: Aortic atherosclerosis. Similar enlargement of matted appearing left retroperitoneal lymph nodes measuring up to 2.4 x 2.3 cm (series 2, image 64). Reproductive: No mass or other abnormality. Other: No abdominal wall hernia or abnormality. Small volume ascites throughout the abdomen and pelvis, with new extensive peritoneal soft tissue nodularity, particularly throughout the left upper quadrant (series 2, image 56). Musculoskeletal: No acute osseous findings. IMPRESSION: 1. Status post pull-through esophagectomy. 2. When compared to most recent imaging of the abdomen pelvis dated 04/06/2022, interval enlargement of a hypodense soft tissue mass in the vicinity of the gastroesophageal junction, abutting the gastric pull-through, the celiac axis, and the superior pancreatic head, measuring 4.4 x 3.4 cm, previously 3.3 x 2.9 cm. This  is consistent with a worsening soft tissue metastasis. 3. Similar enlargement of matted appearing left retroperitoneal lymph nodes which remain concerning for nodal metastatic disease. 4. Small volume ascites throughout the abdomen and pelvis, with new extensive peritoneal soft tissue nodularity, particularly throughout the left upper quadrant, consistent with peritoneal carcinomatosis. 5. Very extensive ground-glass and fine nodular airspace opacity throughout the lungs, which is improved in the lung bases when compared to most recent imaging of the chest dated 01/08/2023, but significantly worsened elsewhere throughout the lungs. Consolidative airspace opacity and bronchiolar plugging previously seen in the lung bases is improved. Findings are consistent with fluctuant, ongoing  multifocal infection or inflammation. In the setting of chemotherapy or immunotherapy, drug toxicities a significant differential consideration for ground-glass airspace disease. 6. Unchanged enlargement of pretracheal lymph nodes, nonspecific, possibly reactive, nodal metastatic disease not excluded. 7. Multiple new although nonacute retracted, hypodense splenic infarctions. Aortic Atherosclerosis (ICD10-I70.0) and Emphysema (ICD10-J43.9). Electronically Signed   By: Jearld Lesch M.D.   On: 02/15/2023 18:38    Microbiology: Results for orders placed or performed during the hospital encounter of 02/15/23  Blood culture (single)     Status: None   Collection Time: 02/15/23  5:04 PM   Specimen: BLOOD  Result Value Ref Range Status   Specimen Description BLOOD BLOOD RIGHT FOREARM  Final   Special Requests   Final    BOTTLES DRAWN AEROBIC AND ANAEROBIC Blood Culture adequate volume   Culture   Final    NO GROWTH 5 DAYS Performed at [facility], [address], [place], [place] [address]    Report Status 02/20/2023 FINAL  Final  MRSA Next Gen by PCR, Nasal     Status: None   Collection Time: 02/16/23 12:16 PM   Specimen: Nasal Mucosa; Nasal Swab  Result Value Ref Range Status   MRSA by PCR Next Gen NOT DETECTED NOT DETECTED Final    Comment: (NOTE) The GeneXpert MRSA Assay (FDA approved for NASAL specimens only), is one component of a comprehensive MRSA colonization surveillance program. It is not intended to diagnose MRSA infection nor to guide or monitor treatment for MRSA infections. Test performance is not FDA approved in patients less than 69 years old. Performed at [facility], [address] [place], [place] [address]   C Difficile Quick Screen w PCR reflex     Status: None   Collection Time: 02/16/23  4:47 PM   Specimen: Stool  Result Value Ref Range Status   C Diff antigen NEGATIVE NEGATIVE Final   C Diff toxin NEGATIVE NEGATIVE Final   C Diff  interpretation No C. difficile detected.  Final    Comment: Performed at [facility], [address] [place], [place] [address]  Gastrointestinal Panel by PCR , Stool     Status: Abnormal   Collection Time: 02/16/23  4:47 PM   Specimen: Stool  Result Value Ref Range Status   Campylobacter species NOT DETECTED NOT DETECTED Final   Plesimonas shigelloides NOT DETECTED NOT DETECTED Final   Salmonella species NOT DETECTED NOT DETECTED Final   Yersinia enterocolitica NOT DETECTED NOT DETECTED Final   Vibrio species NOT DETECTED NOT DETECTED Final   Vibrio cholerae NOT DETECTED NOT DETECTED Final   Enteroaggregative E coli (EAEC) NOT DETECTED NOT DETECTED Final   Enteropathogenic E coli (EPEC) DETECTED (A) NOT DETECTED Final    Comment: RESULT CALLED TO, READ BACK BY AND VERIFIED WITH: AMANDA PEREZ 02/16/23 1822 MU    Enterotoxigenic E  coli (ETEC) NOT DETECTED NOT DETECTED Final   Shiga like toxin producing E coli (STEC) NOT DETECTED NOT DETECTED Final   Shigella/Enteroinvasive E coli (EIEC) NOT DETECTED NOT DETECTED Final   Cryptosporidium NOT DETECTED NOT DETECTED Final   Cyclospora cayetanensis NOT DETECTED NOT DETECTED Final   Entamoeba histolytica NOT DETECTED NOT DETECTED Final   Giardia lamblia NOT DETECTED NOT DETECTED Final   Adenovirus F40/41 NOT DETECTED NOT DETECTED Final   Astrovirus NOT DETECTED NOT DETECTED Final   Norovirus GI/GII NOT DETECTED NOT DETECTED Final   Rotavirus A NOT DETECTED NOT DETECTED Final   Sapovirus (I, II, IV, and V) NOT DETECTED NOT DETECTED Final    Comment: Performed at [facility], [address] [place], [place] [address]    Labs: CBC: Recent Labs  Lab 02/15/23 1704 02/15/23 2101 02/16/23 0518 02/17/23 1205 02/18/23 0502 02/19/23 0515  WBC 14.0* 11.8* 10.6* 6.9 9.3 6.7  NEUTROABS 11.5*  --   --   --   --   --   HGB 10.4* 9.8* 9.6* 9.1* 9.5* 9.1*  HCT 32.6* 30.1* 29.6* 28.4* 28.6* 28.2*  MCV 74.9* 73.6* 73.8*  74.7* 72.6* 74.8*  PLT 518* 446* 483* 508* 519* 451*   Basic Metabolic Panel: Recent Labs  Lab 02/15/23 1704 02/16/23 0518 02/17/23 1205 02/18/23 0502 02/19/23 0515  NA 134* 134* 135 137 135  K 3.9 3.9 3.8 3.8 3.9  CL 97* 101 104 106 104  CO2 29 25 24 26 26   GLUCOSE 105* 109* 103* 109* 93  BUN 9 9 8 9 11   CREATININE 0.55 0.59 0.59 0.54 0.57  CALCIUM 8.4* 8.3* 8.2* 8.4* 8.2*  MG  --   --  1.9  --   --   PHOS  --   --  3.3  --   --    Liver Function Tests: Recent Labs  Lab 02/15/23 1704 02/16/23 0518  AST 19 16  ALT 13 12  ALKPHOS 64 52  BILITOT 0.7 0.7  PROT 6.8 6.3*  ALBUMIN 2.7* 2.6*   CBG: No results for input(s): "GLUCAP" in the last 168 hours.  Discharge time spent: greater than 30 minutes.  Signed: Alford Highland, MD [facility] 02/20/2023

## 2023-02-20 NOTE — Discharge Instructions (Signed)
I prescribed 5 days of long acting pain meds and short acting pain medication into the [facility] pharmacy.  Keep appointment with pain management specialist.  Narcan nasal spray as needed for opiod reversal  Miralax prescribed twice a day to avoid constipation (can hold if you have diarrhea)

## 2023-02-23 ENCOUNTER — Other Ambulatory Visit: Payer: Self-pay

## 2023-06-04 DEATH — deceased
